# Patient Record
Sex: Male | Born: 1935 | Race: Black or African American | Hispanic: No | Smoking: Former smoker
Health system: Southern US, Community
[De-identification: ages and names within clinical notes are randomized; demographics above are authoritative.]

## PROBLEM LIST (undated history)

## (undated) DIAGNOSIS — I255 Ischemic cardiomyopathy: Secondary | ICD-10-CM

## (undated) DIAGNOSIS — C61 Malignant neoplasm of prostate: Secondary | ICD-10-CM

## (undated) DIAGNOSIS — C241 Malignant neoplasm of ampulla of Vater: Secondary | ICD-10-CM

## (undated) DIAGNOSIS — Z9885 Transplanted organ removal status: Secondary | ICD-10-CM

## (undated) DIAGNOSIS — N183 Chronic kidney disease, stage 3 unspecified: Secondary | ICD-10-CM

## (undated) DIAGNOSIS — C801 Malignant (primary) neoplasm, unspecified: Secondary | ICD-10-CM

## (undated) DIAGNOSIS — C3490 Malignant neoplasm of unspecified part of unspecified bronchus or lung: Secondary | ICD-10-CM

## (undated) DIAGNOSIS — Z905 Acquired absence of kidney: Secondary | ICD-10-CM

## (undated) DIAGNOSIS — M109 Gout, unspecified: Secondary | ICD-10-CM

## (undated) DIAGNOSIS — I10 Essential (primary) hypertension: Secondary | ICD-10-CM

## (undated) DIAGNOSIS — I340 Nonrheumatic mitral (valve) insufficiency: Secondary | ICD-10-CM

## (undated) DIAGNOSIS — E782 Mixed hyperlipidemia: Secondary | ICD-10-CM

## (undated) DIAGNOSIS — I2510 Atherosclerotic heart disease of native coronary artery without angina pectoris: Secondary | ICD-10-CM

## (undated) HISTORY — DX: Gout, unspecified: M10.9

## (undated) HISTORY — PX: CARDIAC SURGERY: SHX584

## (undated) HISTORY — PX: KIDNEY SURGERY: SHX687

---

## 2012-01-14 ENCOUNTER — Encounter (HOSPITAL_COMMUNITY): Payer: Self-pay | Admitting: Emergency Medicine

## 2012-01-14 ENCOUNTER — Emergency Department (HOSPITAL_COMMUNITY): Payer: Medicare Other

## 2012-01-14 ENCOUNTER — Observation Stay (HOSPITAL_COMMUNITY)
Admission: EM | Admit: 2012-01-14 | Discharge: 2012-01-15 | Disposition: A | Discharge status: Home / Self Care | Payer: Medicare Other | Attending: Internal Medicine | Admitting: Internal Medicine

## 2012-01-14 DIAGNOSIS — N289 Disorder of kidney and ureter, unspecified: Secondary | ICD-10-CM | POA: Insufficient documentation

## 2012-01-14 DIAGNOSIS — Z905 Acquired absence of kidney: Secondary | ICD-10-CM | POA: Insufficient documentation

## 2012-01-14 DIAGNOSIS — I10 Essential (primary) hypertension: Secondary | ICD-10-CM

## 2012-01-14 DIAGNOSIS — I2510 Atherosclerotic heart disease of native coronary artery without angina pectoris: Secondary | ICD-10-CM | POA: Diagnosis present

## 2012-01-14 DIAGNOSIS — I200 Unstable angina: Secondary | ICD-10-CM

## 2012-01-14 DIAGNOSIS — Z9885 Transplanted organ removal status: Secondary | ICD-10-CM

## 2012-01-14 DIAGNOSIS — R0602 Shortness of breath: Secondary | ICD-10-CM | POA: Insufficient documentation

## 2012-01-14 DIAGNOSIS — R55 Syncope and collapse: Secondary | ICD-10-CM | POA: Insufficient documentation

## 2012-01-14 DIAGNOSIS — Z951 Presence of aortocoronary bypass graft: Secondary | ICD-10-CM | POA: Insufficient documentation

## 2012-01-14 DIAGNOSIS — R079 Chest pain, unspecified: Principal | ICD-10-CM

## 2012-01-14 HISTORY — DX: Transplanted organ removal status: Z98.85

## 2012-01-14 HISTORY — DX: Nonrheumatic mitral (valve) insufficiency: I34.0

## 2012-01-14 HISTORY — DX: Essential (primary) hypertension: I10

## 2012-01-14 LAB — BASIC METABOLIC PANEL
BUN: 31 mg/dL — ABNORMAL HIGH (ref 6–23)
Calcium: 9.7 mg/dL (ref 8.4–10.5)
Creatinine, Ser: 2.1 mg/dL — ABNORMAL HIGH (ref 0.50–1.35)
GFR calc Af Amer: 33 mL/min — ABNORMAL LOW (ref >90)
GFR calc non Af Amer: 29 mL/min — ABNORMAL LOW (ref >90)

## 2012-01-14 LAB — CBC WITH DIFFERENTIAL/PLATELET
Basophils Absolute: 0 10*3/uL (ref 0.0–0.1)
Basophils Relative: 0 % (ref 0–1)
HCT: 38.6 % — ABNORMAL LOW (ref 39.0–52.0)
MCHC: 35.2 g/dL (ref 30.0–36.0)
Monocytes Absolute: 0.4 10*3/uL (ref 0.1–1.0)
Neutro Abs: 10.1 10*3/uL — ABNORMAL HIGH (ref 1.7–7.7)
Neutrophils Relative %: 88 % — ABNORMAL HIGH (ref 43–77)
Platelets: 123 10*3/uL — ABNORMAL LOW (ref 150–400)
RDW: 12.7 % (ref 11.5–15.5)

## 2012-01-14 MED ORDER — SODIUM CHLORIDE 0.9 % IJ SOLN: 3.0000 mL | INTRAMUSCULAR | Status: DC | PRN

## 2012-01-14 MED ORDER — SODIUM CHLORIDE 0.9 % IV SOLN
250.0000 mL | INTRAVENOUS | Status: DC | PRN
  Administered 2012-01-14: 250 mL via INTRAVENOUS

## 2012-01-14 MED ORDER — SODIUM CHLORIDE 0.9 % IJ SOLN
3.0000 mL | Freq: Two times a day (BID) | INTRAMUSCULAR | Status: DC
  Administered 2012-01-14: 3 mL via INTRAVENOUS
  Filled 2012-01-14: qty 3

## 2012-01-14 MED ORDER — ASPIRIN 81 MG PO CHEW
324.0000 mg | CHEWABLE_TABLET | Freq: Once | ORAL | Status: AC
  Administered 2012-01-14: 324 mg via ORAL
  Filled 2012-01-14: qty 4

## 2012-01-14 NOTE — ED Notes (Signed)
Patient complaining of chest pain starting approximately 1500 today radiating into back. Also complaining of shortness of breath. States the pain "feels like it is thumping or pumping."

## 2012-01-14 NOTE — H&P (Signed)
Chief Complaint:  cp  HPI: 78 yo male s/p cabg 1996 3v comes in with [facility] no radiation lasted 30 min worse or improved by nothing relieved on its own with no assocaited n/v or sob or le edema.  Says had nromal stress test in the past year by his cardiologist.  No fevers no cough.  S/p nephroctomy says his kidney "bleed" and they had to remove it.  Does not know if ckd exists.  No dysuria.  Review of Systems:  O/w neg  Past Medical History: Past Medical History  Diagnosis Date  . MI (mitral incompetence)   . Hypertension   . 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 Kidney surgery   . Cardiac surgery     Medications: Prior to Admission medications   Medication Sig Start Date End Date Taking? Authorizing Provider  aspirin EC 81 MG tablet Take 81 mg by mouth daily.   Yes Historical Provider, MD  atorvastatin (LIPITOR) 40 MG tablet Take 40 mg by mouth daily.   Yes Historical Provider, MD  benazepril-hydrochlorthiazide (LOTENSIN HCT) 20-12.5 MG per tablet Take 1 tablet by mouth 2 (two) times daily.   Yes Historical Provider, MD  fish oil-omega-3 fatty acids 1000 MG capsule Take 1 g by mouth 2 (two) times daily.   Yes Historical Provider, MD    Allergies:   Allergies  Allergen Reactions  . Penicillins Hives    Social History:  reports that he has quit smoking. He does not have any smokeless tobacco history on file. He reports that he does not drink alcohol or use illicit drugs.  Family History: History reviewed. No pertinent family history.  Physical Exam: Filed Vitals:   01/14/12 2022 01/14/12 2025 01/14/12 2123 01/14/12 2152  BP:  127/63 121/67 136/63  Pulse:  67  62  Temp:  97.9 F (36.6 C)    TempSrc:  Oral    Resp:  18 18 20   Height: 5\' 6"  (1.676 m)     Weight: 70.308 kg (155 lb)     SpO2:  96% 100% 100%   General appearance: alert, cooperative and no distress Lungs: clear to auscultation bilaterally Heart:  regular rate and rhythm, S1, S2 normal, no murmur, click, rub or gallop Abdomen: soft, non-tender; bowel sounds normal; no masses,  no organomegaly Extremities: extremities normal, atraumatic, no cyanosis or edema Pulses: 2+ and symmetric Skin: Skin color, texture, turgor normal. No rashes or lesions Neurologic: Grossly normal    Labs on Admission:   [facility] 01/14/12 2111  NA 137  K 4.1  CL 99  CO2 28  GLUCOSE 113*  BUN 31*  CREATININE 2.10*  CALCIUM 9.7  MG --  PHOS --    Basename 01/14/12 2111  WBC 11.4*  NEUTROABS 10.1*  HGB 13.6  HCT 38.6*  MCV 85.4  PLT 123*    Basename 01/14/12 2111  CKTOTAL --  CKMB --  CKMBINDEX --  TROPONINI <0.30   Radiological Exams on Admission: Dg Chest Portable 1 View  01/14/2012  *RADIOLOGY REPORT*  Clinical Data: Chest pain today.  PORTABLE CHEST - 1 VIEW  Comparison: None.  Findings: Postoperative changes in the mediastinum with sternotomy wires, surgical clips, and vascular markers present.  Cardiac enlargement with normal pulmonary vascularity.  Slight fibrosis in the lung bases.  Probable emphysematous changes in the upper lungs. No focal airspace consolidation or edema.  No blunting of costophrenic angles.  No pneumothorax.  Mediastinal contours  appear intact.  Tortuous aorta.  IMPRESSION: Cardiac enlargement and emphysematous changes.  No evidence of active pulmonary disease.   Original Report Authenticated By: Marlon Pel, M.D.     Assessment/Plan Present on Admission:  78 yo male w cp .Chest pain .CAD (coronary artery disease) .Hypertension  Romi, tele ck echo.  Also u/s kidneys due renal failure.  Will need to get records from pcp.  Do not know baseline Cr.  Also need to get stress test results to make sure it was recent and normal.  Glorianne Proctor A [phone] 01/14/2012, 10:41 PM

## 2012-01-14 NOTE — ED Provider Notes (Signed)
History  This chart was scribed for Doug Sou, MD by Bennett Scrape. This patient was seen in room APA04/APA04 and the patient's care was started at 9:07PM.  CSN: [id number]  Arrival date & time 01/14/12  2015   First MD Initiated Contact with Patient 01/14/12 2107      Chief Complaint  Patient presents with  . Chest Pain     Patient is a 78 y.o. male presenting with chest pain. The history is provided by the patient. No language interpreter was used.  Chest Pain The chest pain began 6 - 12 hours ago. Duration of episode(s) is 2 hours. Chest pain occurs constantly. The chest pain is improving. The pain radiates to the upper back. Primary symptoms include shortness of breath. Pertinent negatives for primary symptoms include no cough and no dizziness. He tried nothing for the symptoms.  His past medical history is significant for MI.    John Shepard is a 78 y.o. male with a prior MI and h/o CABG who presents to the Emergency Department complaining of 2 hours of gradual onset, gradually improving, constant left-sided CP that doesn't radiate described as throbbing with associated SOB and light-headedness. Other associated symptoms included nausea and shortness of breath. Patient is asymptomatic now episode lasted 2 hours. Felt like "heart pain but he's had in the past The CP started 6 hours ago and he denies having symptoms currently. He denies taking OTC medications at home to improve symptoms. He reports having prior episodes of similar symptoms several years ago but denies any recent episodes. He denies diaphoresis, nausea and emesis as associated symptoms. He is a former smoker but denies alcohol use.    Dr. Armanda Magic is Cardiologist in [place].  Past Medical History  Diagnosis Date  . MI (mitral incompetence)   . Hypertension   . 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 Kidney surgery   . Cardiac  surgery- bypass     History reviewed. No pertinent family history.  History  Substance Use Topics  . Smoking status: Former Games developer  . Smokeless tobacco: Not on file  . Alcohol Use: No      Review of Systems  Constitutional: Negative.   HENT: Negative.   Respiratory: Positive for shortness of breath. Negative for cough.   Cardiovascular: Positive for chest pain. Negative for leg swelling.  Gastrointestinal: Negative.   Musculoskeletal: Negative.   Skin: Negative.   Neurological: Positive for light-headedness. Negative for dizziness.  Hematological: Negative.   Psychiatric/Behavioral: Negative.   All other systems reviewed and are negative.    Allergies  Penicillins  Home Medications  No current outpatient prescriptions on file.  Triage Vitals: BP 127/63  Pulse 67  Temp 97.9 F (36.6 C) (Oral)  Resp 18  Ht 5\' 6"  (1.676 m)  Wt 155 lb (70.308 kg)  BMI 25.02 kg/m2  SpO2 96%  Physical Exam  Nursing note and vitals reviewed. Constitutional: He appears well-developed and well-nourished.  HENT:  Head: Normocephalic and atraumatic.  Eyes: Conjunctivae are normal. Pupils are equal, round, and reactive to light.  Neck: Neck supple. No tracheal deviation present. No thyromegaly present.  Cardiovascular: Normal rate and regular rhythm.   No murmur heard. Pulmonary/Chest: Effort normal and breath sounds normal.       Sternotomy scar  Abdominal: Soft. Bowel sounds are normal. He exhibits no distension. There is no tenderness.  Musculoskeletal: Normal range of motion.  He exhibits no edema and no tenderness.  Neurological: He is alert. Coordination normal.  Skin: Skin is warm and dry. No rash noted.  Psychiatric: He has a normal mood and affect.    ED Course  Procedures (including critical care time)  DIAGNOSTIC STUDIES: Oxygen Saturation is 96% on room air, adequate by my interpretation.    COORDINATION OF CARE: 9:12PM-Discussed treatment plan which includes blood  work and admission with pt at bedside and pt agreed to plan.   Results for orders placed during the hospital encounter of 01/14/12  TROPONIN I      Component Value Range   Troponin I <0.30  <0.30 ng/mL  BASIC METABOLIC PANEL      Component Value Range   Sodium 137  135 - 145 mEq/L   Potassium 4.1  3.5 - 5.1 mEq/L   Chloride 99  96 - 112 mEq/L   CO2 28  19 - 32 mEq/L   Glucose, Bld 113 (*) 70 - 99 mg/dL   BUN 31 (*) 6 - 23 mg/dL   Creatinine, Ser 1.61 (*) 0.50 - 1.35 mg/dL   Calcium 9.7  8.4 - 09.6 mg/dL   GFR calc non Af Amer 29 (*) >90 mL/min   GFR calc Af Amer 33 (*) >90 mL/min  CBC WITH DIFFERENTIAL      Component Value Range   WBC 11.4 (*) 4.0 - 10.5 K/uL   RBC 4.52  4.22 - 5.81 MIL/uL   Hemoglobin 13.6  13.0 - 17.0 g/dL   HCT 04.5 (*) 40.9 - 81.1 %   MCV 85.4  78.0 - 100.0 fL   MCH 30.1  26.0 - 34.0 pg   MCHC 35.2  30.0 - 36.0 g/dL   RDW 91.4  78.2 - 95.6 %   Platelets 123 (*) 150 - 400 K/uL   Neutrophils Relative 88 (*) 43 - 77 %   Neutro Abs 10.1 (*) 1.7 - 7.7 K/uL   Lymphocytes Relative 8 (*) 12 - 46 %   Lymphs Abs 0.9  0.7 - 4.0 K/uL   Monocytes Relative 4  3 - 12 %   Monocytes Absolute 0.4  0.1 - 1.0 K/uL   Eosinophils Relative 0  0 - 5 %   Eosinophils Absolute 0.0  0.0 - 0.7 K/uL   Basophils Relative 0  0 - 1 %   Basophils Absolute 0.0  0.0 - 0.1 K/uL   Dg Chest Portable 1 View  01/14/2012  *RADIOLOGY REPORT*  Clinical Data: Chest pain today.  PORTABLE CHEST - 1 VIEW  Comparison: None.  Findings: Postoperative changes in the mediastinum with sternotomy wires, surgical clips, and vascular markers present.  Cardiac enlargement with normal pulmonary vascularity.  Slight fibrosis in the lung bases.  Probable emphysematous changes in the upper lungs. No focal airspace consolidation or edema.  No blunting of costophrenic angles.  No pneumothorax.  Mediastinal contours appear intact.  Tortuous aorta.  IMPRESSION: Cardiac enlargement and emphysematous changes.  No  evidence of active pulmonary disease.   Original Report Authenticated By: Marlon Pel, M.D.      No diagnosis found.   Date: 01/14/2012  Rate: 70  Rhythm: normal sinus rhythm  QRS Axis: normal  Intervals: normal  ST/T Wave abnormalities: nonspecific T wave changes  Conduction Disutrbances:none  Narrative Interpretation:   Old EKG Reviewed: none available PVCs present   Results for orders placed during the hospital encounter of 01/14/12  TROPONIN I      Component Value Range  Troponin I <0.30  <0.30 ng/mL  BASIC METABOLIC PANEL      Component Value Range   Sodium 137  135 - 145 mEq/L   Potassium 4.1  3.5 - 5.1 mEq/L   Chloride 99  96 - 112 mEq/L   CO2 28  19 - 32 mEq/L   Glucose, Bld 113 (*) 70 - 99 mg/dL   BUN 31 (*) 6 - 23 mg/dL   Creatinine, Ser 1.61 (*) 0.50 - 1.35 mg/dL   Calcium 9.7  8.4 - 09.6 mg/dL   GFR calc non Af Amer 29 (*) >90 mL/min   GFR calc Af Amer 33 (*) >90 mL/min  CBC WITH DIFFERENTIAL      Component Value Range   WBC 11.4 (*) 4.0 - 10.5 K/uL   RBC 4.52  4.22 - 5.81 MIL/uL   Hemoglobin 13.6  13.0 - 17.0 g/dL   HCT 04.5 (*) 40.9 - 81.1 %   MCV 85.4  78.0 - 100.0 fL   MCH 30.1  26.0 - 34.0 pg   MCHC 35.2  30.0 - 36.0 g/dL   RDW 91.4  78.2 - 95.6 %   Platelets 123 (*) 150 - 400 K/uL   Neutrophils Relative 88 (*) 43 - 77 %   Neutro Abs 10.1 (*) 1.7 - 7.7 K/uL   Lymphocytes Relative 8 (*) 12 - 46 %   Lymphs Abs 0.9  0.7 - 4.0 K/uL   Monocytes Relative 4  3 - 12 %   Monocytes Absolute 0.4  0.1 - 1.0 K/uL   Eosinophils Relative 0  0 - 5 %   Eosinophils Absolute 0.0  0.0 - 0.7 K/uL   Basophils Relative 0  0 - 1 %   Basophils Absolute 0.0  0.0 - 0.1 K/uL   Dg Chest Portable 1 View  01/14/2012  *RADIOLOGY REPORT*  Clinical Data: Chest pain today.  PORTABLE CHEST - 1 VIEW  Comparison: None.  Findings: Postoperative changes in the mediastinum with sternotomy wires, surgical clips, and vascular markers present.  Cardiac enlargement with normal  pulmonary vascularity.  Slight fibrosis in the lung bases.  Probable emphysematous changes in the upper lungs. No focal airspace consolidation or edema.  No blunting of costophrenic angles.  No pneumothorax.  Mediastinal contours appear intact.  Tortuous aorta.  IMPRESSION: Cardiac enlargement and emphysematous changes.  No evidence of active pulmonary disease.   Original Report Authenticated By: Marlon Pel, M.D.     10:35 PM remains asymptomatic MDM  Spoke with Dr. Onalee Hua plan 23 observation telemetry Diagnosis #1 unstable angina #2 renal insufficiency          Doug Sou, MD 01/14/12 2240

## 2012-01-14 NOTE — ED Notes (Signed)
Patient denies pain and is resting comfortably.  

## 2012-01-15 ENCOUNTER — Encounter (HOSPITAL_COMMUNITY): Payer: Self-pay

## 2012-01-15 ENCOUNTER — Observation Stay (HOSPITAL_COMMUNITY): Payer: Medicare Other

## 2012-01-15 LAB — CARDIAC PANEL(CRET KIN+CKTOT+MB+TROPI)
Relative Index: INVALID (ref 0.0–2.5)
Relative Index: INVALID (ref 0.0–2.5)
Total CK: 76 U/L (ref 7–232)
Total CK: 78 U/L (ref 7–232)
Troponin I: <0.3 ng/mL (ref <0.30)

## 2012-01-15 LAB — URINALYSIS, ROUTINE W REFLEX MICROSCOPIC
Bilirubin Urine: NEGATIVE
Glucose, UA: NEGATIVE mg/dL
Hgb urine dipstick: NEGATIVE
Ketones, ur: NEGATIVE mg/dL
Nitrite: NEGATIVE
Specific Gravity, Urine: 1.005 (ref 1.005–1.030)
pH: 5.5 (ref 5.0–8.0)

## 2012-01-15 MED ORDER — ASPIRIN EC 81 MG PO TBEC
81.0000 mg | DELAYED_RELEASE_TABLET | Freq: Every day | ORAL | Status: DC
  Administered 2012-01-15: 81 mg via ORAL
  Filled 2012-01-15: qty 1

## 2012-01-15 MED ORDER — SODIUM CHLORIDE 0.9 % IV SOLN
INTRAVENOUS | Status: DC
  Administered 2012-01-15 (×2): via INTRAVENOUS

## 2012-01-15 MED ORDER — ATORVASTATIN CALCIUM 40 MG PO TABS: 40.0000 mg | ORAL_TABLET | Freq: Every day | ORAL | Status: DC

## 2012-01-15 MED ORDER — HEPARIN SODIUM (PORCINE) 5000 UNIT/ML IJ SOLN: 5000.0000 [IU] | Freq: Three times a day (TID) | INTRAMUSCULAR | Status: DC

## 2012-01-15 MED ORDER — SODIUM CHLORIDE 0.9 % IJ SOLN
3.0000 mL | Freq: Two times a day (BID) | INTRAMUSCULAR | Status: DC
  Administered 2012-01-15: 3 mL via INTRAVENOUS
  Filled 2012-01-15: qty 3

## 2012-01-15 MED ORDER — NITROGLYCERIN 0.4 MG SL SUBL: 0.4000 mg | SUBLINGUAL_TABLET | SUBLINGUAL | Status: AC | PRN

## 2012-01-15 NOTE — Discharge Summary (Signed)
Physician Discharge Summary  Patient ID: John Shepard MRN: [id number] DOB/AGE: [birth year] 78 y.o.  Admit date: 01/14/2012 Discharge date: 01/15/2012  Discharge Diagnoses:  Principal Problem:  *Chest pain Active Problems:  CAD (coronary artery disease) Renal insufficiency  Hypertension Post nephrectomy  Medication List  As of 01/15/2012  1:30 PM   TAKE these medications         aspirin EC 81 MG tablet   Take 81 mg by mouth daily.      atorvastatin 40 MG tablet   Commonly known as: LIPITOR   Take 40 mg by mouth daily.      benazepril-hydrochlorthiazide 20-12.5 MG per tablet   Commonly known as: LOTENSIN HCT   Take 1 tablet by mouth 2 (two) times daily.      fish oil-omega-3 fatty acids 1000 MG capsule   Take 1 g by mouth 2 (two) times daily.      nitroGLYCERIN 0.4 MG SL tablet   Commonly known as: NITROSTAT   Place 1 tablet (0.4 mg total) under the tongue every 5 (five) minutes as needed for chest pain (3 tablets maximum).            Discharge Orders    Future Orders Please Complete By Expires   Diet - low sodium heart healthy      Activity as tolerated - No restrictions         Follow-up Information    Schedule an appointment as soon as possible for a visit with Gretta Cool Antoin, LCSW. (If symptoms worsen)    Contact information:   - - - - [phone]          Disposition: home  Discharged Condition: stable  Consults:  none  Labs:   Results for orders placed during the hospital encounter of 01/14/12 (from the past 48 hour(s))  TROPONIN I     Status: Normal   Collection Time   01/14/12  9:11 PM      Component Value Range Comment   Troponin I <0.30  <0.30 ng/mL   BASIC METABOLIC PANEL     Status: Abnormal   Collection Time   01/14/12  9:11 PM      Component Value Range Comment   Sodium 137  135 - 145 mEq/L    Potassium 4.1  3.5 - 5.1 mEq/L    Chloride 99  96 - 112 mEq/L    CO2 28  19 - 32 mEq/L    Glucose, Bld 113 (*) 70 - 99 mg/dL     BUN 31 (*) 6 - 23 mg/dL    Creatinine, Ser 4.09 (*) 0.50 - 1.35 mg/dL    Calcium 9.7  8.4 - 81.1 mg/dL    GFR calc non Af Amer 29 (*) >90 mL/min    GFR calc Af Amer 33 (*) >90 mL/min   CBC WITH DIFFERENTIAL     Status: Abnormal   Collection Time   01/14/12  9:11 PM      Component Value Range Comment   WBC 11.4 (*) 4.0 - 10.5 K/uL    RBC 4.52  4.22 - 5.81 MIL/uL    Hemoglobin 13.6  13.0 - 17.0 g/dL    HCT 91.4 (*) 78.2 - 52.0 %    MCV 85.4  78.0 - 100.0 fL    MCH 30.1  26.0 - 34.0 pg    MCHC 35.2  30.0 - 36.0 g/dL    RDW 95.6  21.3 - 08.6 %    Platelets 123 (*)  150 - 400 K/uL    Neutrophils Relative 88 (*) 43 - 77 %    Neutro Abs 10.1 (*) 1.7 - 7.7 K/uL    Lymphocytes Relative 8 (*) 12 - 46 %    Lymphs Abs 0.9  0.7 - 4.0 K/uL    Monocytes Relative 4  3 - 12 %    Monocytes Absolute 0.4  0.1 - 1.0 K/uL    Eosinophils Relative 0  0 - 5 %    Eosinophils Absolute 0.0  0.0 - 0.7 K/uL    Basophils Relative 0  0 - 1 %    Basophils Absolute 0.0  0.0 - 0.1 K/uL   CARDIAC PANEL(CRET KIN+CKTOT+MB+TROPI)     Status: Normal   Collection Time   01/15/12  1:22 AM      Component Value Range Comment   Total CK 76  7 - 232 U/L    CK, MB 2.3  0.3 - 4.0 ng/mL    Troponin I <0.30  <0.30 ng/mL    Relative Index RELATIVE INDEX IS INVALID  0.0 - 2.5   URINALYSIS, ROUTINE W REFLEX MICROSCOPIC     Status: Normal   Collection Time   01/15/12  8:15 AM      Component Value Range Comment   Color, Urine YELLOW  YELLOW    APPearance CLEAR  CLEAR    Specific Gravity, Urine 1.005  1.005 - 1.030    pH 5.5  5.0 - 8.0    Glucose, UA NEGATIVE  NEGATIVE mg/dL    Hgb urine dipstick NEGATIVE  NEGATIVE    Bilirubin Urine NEGATIVE  NEGATIVE    Ketones, ur NEGATIVE  NEGATIVE mg/dL    Protein, ur NEGATIVE  NEGATIVE mg/dL    Urobilinogen, UA 0.2  0.0 - 1.0 mg/dL    Nitrite NEGATIVE  NEGATIVE    Leukocytes, UA NEGATIVE  NEGATIVE MICROSCOPIC NOT DONE ON URINES WITH NEGATIVE PROTEIN, BLOOD, LEUKOCYTES, NITRITE, OR  GLUCOSE <1000 mg/dL.  CARDIAC PANEL(CRET KIN+CKTOT+MB+TROPI)     Status: Normal   Collection Time   01/15/12  8:55 AM      Component Value Range Comment   Total CK 78  7 - 232 U/L    CK, MB 2.2  0.3 - 4.0 ng/mL    Troponin I <0.30  <0.30 ng/mL    Relative Index RELATIVE INDEX IS INVALID  0.0 - 2.5     Diagnostics:  US Renal  01/15/2012  *RADIOLOGY REPORT*  Clinical Data: History of acute renal failure.  History of prior left nephrectomy.  RENAL/URINARY TRACT ULTRASOUND COMPLETE  Comparison:  None.  Findings:  Right Kidney:  Right renal length is 12.9 cm. Examination of the right kidney shows no evidence of hydronephrosis, solid or cystic mass, calculus, parenchymal loss, or parenchymal textural abnormality.  Left Kidney:  There is history of previous left nephrectomy.  No left kidney is evident.  Bladder:  No urinary bladder abnormality is seen.  No ureteral jet was visualized.  IMPRESSION: Post left nephrectomy.  Compensatory hypertrophy of right kidney which otherwise appears normal.   Original Report Authenticated By: Crawford Givens, M.D.    Dg Chest Portable 1 View  01/14/2012  *RADIOLOGY REPORT*  Clinical Data: Chest pain today.  PORTABLE CHEST - 1 VIEW  Comparison: None.  Findings: Postoperative changes in the mediastinum with sternotomy wires, surgical clips, and vascular markers present.  Cardiac enlargement with normal pulmonary vascularity.  Slight fibrosis in the lung bases.  Probable emphysematous changes in the  upper lungs. No focal airspace consolidation or edema.  No blunting of costophrenic angles.  No pneumothorax.  Mediastinal contours appear intact.  Tortuous aorta.  IMPRESSION: Cardiac enlargement and emphysematous changes.  No evidence of active pulmonary disease.   Original Report Authenticated By: Marlon Pel, M.D.    EKG: NSR , PACs, LVH, nonspecific ST changes   Hospital Course:  See H&P for complete admission details. John Shepard is a 78 year old black male who  presented with about 30 minutes of chest pain. He has a history of coronary artery disease and CABG in 1996. By the time he was in the emergency room, chest pain had resolved. He sees Dr. Beckie Salts in [place] for his heart disease. Patient was admitted overnight and ruled out for MI. He had no recurrence of chest pain and was able to cannulate about the unit without difficulty. Records were obtained from Dr. Albertina Senegal office. He had a negative stress echocardiogram in May of this year. He is being discharged to home with prescription for sublingual nitroglycerin. He will follow with Dr. Beckie Salts and if he continues to have chest pain, may need cardiac catheterization. His creatinine was elevated here and he is post nephrectomy. Baseline creatinine is unknown.  Discharge Exam:  Blood pressure 155/81, pulse 60, temperature 97.6 F (36.4 C), temperature source Oral, resp. rate 20, height 5\' 6"  (1.676 m), weight 68.675 kg (151 lb 6.4 oz), SpO2 100.00%.  General: Comfortable. Lungs clear to auscultation bilaterally without wheezes rhonchi or rales Cardiovascular regular rate and rhythm without murmurs gallops rubs Abdomen soft nontender nondistended Extremities no clubbing cyanosis or edema  Signed: Brityn Mastrogiovanni L 01/15/2012, 1:30 PM

## 2012-01-15 NOTE — Plan of Care (Signed)
Problem: Phase I Progression Outcomes Goal: Aspirin unless contraindicated Outcome: Completed/Met Date Met:  01/15/12 In ED

## 2012-01-15 NOTE — Progress Notes (Signed)
Pt's IV removed.  Pt tolerated well.  Pt and family provided with d/c instructions.  Pt verbalized understanding of d/c instructions.  Pt provided with prescription for Nitro tabs.  Pt stable at d/c.

## 2012-01-15 NOTE — Progress Notes (Signed)
UR Chart Review Completed  

## 2015-03-09 ENCOUNTER — Encounter (INDEPENDENT_AMBULATORY_CARE_PROVIDER_SITE_OTHER): Payer: Self-pay | Admitting: *Deleted

## 2015-04-11 ENCOUNTER — Other Ambulatory Visit (INDEPENDENT_AMBULATORY_CARE_PROVIDER_SITE_OTHER): Payer: Self-pay | Admitting: Internal Medicine

## 2015-04-11 ENCOUNTER — Ambulatory Visit (INDEPENDENT_AMBULATORY_CARE_PROVIDER_SITE_OTHER): Payer: Medicare Other | Admitting: Internal Medicine

## 2015-04-11 ENCOUNTER — Telehealth (INDEPENDENT_AMBULATORY_CARE_PROVIDER_SITE_OTHER): Payer: Self-pay | Admitting: *Deleted

## 2015-04-11 ENCOUNTER — Encounter (INDEPENDENT_AMBULATORY_CARE_PROVIDER_SITE_OTHER): Payer: Self-pay | Admitting: Internal Medicine

## 2015-04-11 VITALS — BP 180/62 | HR 65 | Temp 98.4°F | Ht 66.0 in | Wt 146.3 lb

## 2015-04-11 DIAGNOSIS — Z1211 Encounter for screening for malignant neoplasm of colon: Secondary | ICD-10-CM

## 2015-04-11 NOTE — Telephone Encounter (Signed)
Patient needs trilyte 

## 2015-04-11 NOTE — Patient Instructions (Signed)
The risks and benefits such as perforation, bleeding, and infection were reviewed with the patient and is agreeable. 

## 2015-04-11 NOTE — Progress Notes (Signed)
   Subjective:    Patient ID: John Shepard, male    DOB: 1932/11/19, 81 y.o.   MRN: [id number]  HPI Referred to our office by Dr. Briscoe Deutscher for melena/colonoscopy.  He tells me he has been having black stools for a couple of months.  He says all his stools are black. There is no abdominal pain. His appetite is good.  He thinks he may have lost about 10 pounds over the past 4 months.  He usually has a BM daily. No change in his stools.  Hx of prostate cancer. One son had stomach cancer and was a drinker.  No family hx of colon cancer. Takes a BABY ASA daily.     02/26/2015 H and H 13.6 and 41.7, Platelet ct 127.  Total bili 0.7, ALP 90, AST 14, ALT 11    Review of Systems Past Medical History  Diagnosis Date  . MI (mitral incompetence)   . Hypertension   . 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 Gout     Past Surgical History  Procedure Laterality Date  . Kidney surgery    . Cardiac surgery      CABG in 1996    Allergies  Allergen Reactions  . Penicillins Hives    Current Outpatient Prescriptions on File Prior to Visit  Medication Sig Dispense Refill  . aspirin EC 81 MG tablet Take 81 mg by mouth daily.    Marland Kitchen atorvastatin (LIPITOR) 40 MG tablet Take 40 mg by mouth daily.    . benazepril-hydrochlorthiazide (LOTENSIN HCT) 20-12.5 MG per tablet Take 1 tablet by mouth 2 (two) times daily.    . fish oil-omega-3 fatty acids 1000 MG capsule Take 1 g by mouth 2 (two) times daily.    . nitroGLYCERIN (NITROSTAT) 0.4 MG SL tablet Place 1 tablet (0.4 mg total) under the tongue every 5 (five) minutes as needed for chest pain (3 tablets maximum). 15 tablet 0   No current facility-administered medications on file prior to visit.        Objective:   Physical Exam Blood pressure 180/62, pulse 65, temperature 98.4 F (36.9 C), height '5\' 6"'$  (1.676 m), weight 146 lb 4.8 oz (66.361 kg). Alert and oriented. Skin warm and dry. Oral mucosa is moist.   . Sclera anicteric,  conjunctivae is pink. Thyroid not enlarged. No cervical lymphadenopathy. Lungs clear. Heart regular rate and rhythm.  Abdomen is soft. Bowel sounds are positive. No hepatomegaly. No abdominal masses felt. No tenderness.  No edema to lower extremities.   Stool is dark brown and guaiac negative. (I showed patient his stool sample and he agreed this is what is stool usually looked like, sometimes darker)    Lot [id number] Ex 9/17     Assessment & Plan:   Stool negative in office for blood. H and H stable. Patient has never undergone a colonoscopy in the past and I will schedule this for him. The risks and benefits such as perforation, bleeding, and infection were reviewed with the patient and is agreeable.

## 2015-04-14 MED ORDER — PEG 3350-KCL-NA BICARB-NACL 420 G PO SOLR: 4000.0000 mL | Freq: Once | ORAL | Status: DC

## 2015-05-17 ENCOUNTER — Ambulatory Visit (HOSPITAL_COMMUNITY)
Admission: RE | Admit: 2015-05-17 | Discharge: 2015-05-17 | Disposition: A | Discharge status: Home / Self Care | Payer: Medicare Other | Source: Ambulatory Visit | Attending: Internal Medicine | Admitting: Internal Medicine

## 2015-05-17 ENCOUNTER — Encounter (HOSPITAL_COMMUNITY): Payer: Self-pay | Admitting: *Deleted

## 2015-05-17 ENCOUNTER — Encounter (HOSPITAL_COMMUNITY)
Admission: RE | Disposition: A | Discharge status: Home / Self Care | Payer: Self-pay | Source: Ambulatory Visit | Attending: Internal Medicine

## 2015-05-17 DIAGNOSIS — Z79899 Other long term (current) drug therapy: Secondary | ICD-10-CM | POA: Diagnosis not present

## 2015-05-17 DIAGNOSIS — K648 Other hemorrhoids: Secondary | ICD-10-CM | POA: Diagnosis not present

## 2015-05-17 DIAGNOSIS — Z951 Presence of aortocoronary bypass graft: Secondary | ICD-10-CM | POA: Diagnosis not present

## 2015-05-17 DIAGNOSIS — I10 Essential (primary) hypertension: Secondary | ICD-10-CM | POA: Diagnosis not present

## 2015-05-17 DIAGNOSIS — Z7982 Long term (current) use of aspirin: Secondary | ICD-10-CM | POA: Diagnosis not present

## 2015-05-17 DIAGNOSIS — Z1211 Encounter for screening for malignant neoplasm of colon: Secondary | ICD-10-CM | POA: Insufficient documentation

## 2015-05-17 DIAGNOSIS — K644 Residual hemorrhoidal skin tags: Secondary | ICD-10-CM | POA: Insufficient documentation

## 2015-05-17 DIAGNOSIS — Z87891 Personal history of nicotine dependence: Secondary | ICD-10-CM | POA: Insufficient documentation

## 2015-05-17 DIAGNOSIS — D120 Benign neoplasm of cecum: Secondary | ICD-10-CM | POA: Diagnosis not present

## 2015-05-17 DIAGNOSIS — I340 Nonrheumatic mitral (valve) insufficiency: Secondary | ICD-10-CM | POA: Insufficient documentation

## 2015-05-17 HISTORY — PX: COLONOSCOPY: SHX5424

## 2015-05-17 SURGERY — COLONOSCOPY
Anesthesia: Moderate Sedation

## 2015-05-17 MED ORDER — MEPERIDINE HCL 50 MG/ML IJ SOLN
INTRAMUSCULAR | Status: DC | PRN
  Administered 2015-05-17: 25 mg

## 2015-05-17 MED ORDER — MIDAZOLAM HCL 5 MG/5ML IJ SOLN
INTRAMUSCULAR | Status: DC | PRN
  Administered 2015-05-17: 1 mg via INTRAVENOUS
  Administered 2015-05-17: 2 mg via INTRAVENOUS

## 2015-05-17 MED ORDER — MIDAZOLAM HCL 5 MG/5ML IJ SOLN
INTRAMUSCULAR | Status: AC
  Filled 2015-05-17: qty 10

## 2015-05-17 MED ORDER — SODIUM CHLORIDE 0.9 % IV SOLN
INTRAVENOUS | Status: DC
  Administered 2015-05-17: 1000 mL via INTRAVENOUS

## 2015-05-17 MED ORDER — MEPERIDINE HCL 50 MG/ML IJ SOLN
INTRAMUSCULAR | Status: AC
  Filled 2015-05-17: qty 1

## 2015-05-17 NOTE — H&P (Signed)
John Shepard is an 81 y.o. male.   Chief Complaint: Patient is here for colonoscopy. HPI: A shunt is 81 year old African-American male was undergoing colonoscopy primarily for screening purposes. He's been passing dark stool and thought he was passing blood with stool was guaiac-negative. He denies abdominal pain or recent changes of habits. He has never been screened for CRC. Family history is negative for CRC.  Past Medical History  Diagnosis Date  . MI (mitral incompetence)   . Hypertension   . 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 Gout     Past Surgical History  Procedure Laterality Date  . Kidney surgery    . Cardiac surgery      CABG in 1996    History reviewed. No pertinent family history. Social History:  reports that he has quit smoking. He does not have any smokeless tobacco history on file. He reports that he does not drink alcohol or use illicit drugs.  Allergies:  Allergies  Allergen Reactions  . Penicillins Hives    Medications Prior to Admission  Medication Sig Dispense Refill  . aspirin EC 81 MG tablet Take 81 mg by mouth daily.    Marland Kitchen atorvastatin (LIPITOR) 40 MG tablet Take 40 mg by mouth daily.    . polyethylene glycol-electrolytes (NULYTELY/GOLYTELY) 420 G solution Take 4,000 mLs by mouth once. 4000 mL 0  . allopurinol (ZYLOPRIM) 100 MG tablet Take 100 mg by mouth daily.    . benazepril-hydrochlorthiazide (LOTENSIN HCT) 20-12.5 MG per tablet Take 1 tablet by mouth 2 (two) times daily.    . fish oil-omega-3 fatty acids 1000 MG capsule Take 1 g by mouth 2 (two) times daily.    . nitroGLYCERIN (NITROSTAT) 0.4 MG SL tablet Place 1 tablet (0.4 mg total) under the tongue every 5 (five) minutes as needed for chest pain (3 tablets maximum). 15 tablet 0    No results found for this or any previous visit (from the past 48 hour(s)). No results found.  ROS  Blood pressure 176/79, pulse 77, temperature 97.5 F (36.4 C), temperature source Oral, resp.  rate 18, height '5\' 6"'$  (1.676 m), weight 150 lb (68.04 kg), SpO2 100 %. Physical Exam  Constitutional:  Pleasant well-developed thin African-American male in NAD.  HENT:  Mouth/Throat: Oropharynx is clear and moist.  Eyes: Conjunctivae are normal. No scleral icterus.  Neck: No thyromegaly present.  Cardiovascular:  Mid Sternal scar.  Normal S1 with split S2 and faint systolic murmur is heard at left sternal border.  Respiratory: Effort normal and breath sounds normal.  GI: Soft. He exhibits no distension and no mass. There is no tenderness.  Musculoskeletal: He exhibits no edema.  Lymphadenopathy:    He has no cervical adenopathy.  Neurological: He is alert.  Skin: Skin is warm and dry.     Assessment/Plan Average risk screening colonoscopy.  REHMAN,NAJEEB U 05/17/2015, 1:36 PM

## 2015-05-17 NOTE — Op Note (Signed)
COLONOSCOPY PROCEDURE REPORT  PATIENT:  John Shepard  MR#:  [id number] Birthdate:  01-30-1933, 81 y.o., male Endoscopist:  Dr. Rogene Houston, MD Referred By:  Dr. Briscoe Deutscher, MD  Procedure Date: 05/17/2015  Procedure:   Colonoscopy  Indications:  Patient is 81 year old African-American male who is undergoing colonoscopy primarily for screening purposes. Is passing dark stools and thought was blood in stool was guaiac negative. H&H is normal. This is patient's first screening exam. Family history is negative for CRC.  Informed Consent:  The procedure and risks were reviewed with the patient and informed consent was obtained.  Medications:  Demerol 25 mg IV Versed 3 mg IV(2 mg possibly infiltrated)  Description of procedure:  After a digital rectal exam was performed, that colonoscope was advanced from the anus through the rectum and colon to the area of the cecum, ileocecal valve and appendiceal orifice. The cecum was deeply intubated. These structures were well-seen and photographed for the record. From the level of the cecum and ileocecal valve, the scope was slowly and cautiously withdrawn. The mucosal surfaces were carefully surveyed utilizing scope tip to flexion to facilitate fold flattening as needed. The scope was pulled down into the rectum where a thorough exam including retroflexion was performed.  Findings:   Prep excellent. 5 mm polyp cold snared from cecum. Mucosa of rest of the colon and rectum was normal. Dominant hemorrhoids noted below the dentate line.   Therapeutic/Diagnostic Maneuvers Performed:  See above  Complications:  None  EBL: None  Cecal Withdrawal Time:  12 minutes  Impression:  Examination performed to cecum. 5 mm cecal polyp cold snared. Prominent external hemorrhoids.  Recommendations:  Standard instructions given. I will contact patient with biopsy results and further recommendations.  REHMAN,NAJEEB U  05/17/2015 2:21 PM  CC: Dr.  Alison Stalling [facility] & Dr. No ref. provider found

## 2015-05-17 NOTE — Discharge Instructions (Signed)
Resume usual medications and diet. No driving for 24 hours. Physician will call with biopsy results. Colonoscopy, Care After These instructions give you information on caring for yourself after your procedure. Your doctor may also give you more specific instructions. Call your doctor if you have any problems or questions after your procedure. HOME CARE  Do not drive for 24 hours.  Do not sign important papers or use machinery for 24 hours.  You may shower.  You may go back to your usual activities, but go slower for the first 24 hours.  Take rest breaks often during the first 24 hours.  Walk around or use warm packs on your belly (abdomen) if you have belly cramping or gas.  Drink enough fluids to keep your pee (urine) clear or pale yellow.  Resume your normal diet. Avoid heavy or fried foods.  Avoid drinking alcohol for 24 hours or as told by your doctor.  Only take medicines as told by your doctor. If a tissue sample (biopsy) was taken during the procedure:   Do not take aspirin or blood thinners for 7 days, or as told by your doctor.  Do not drink alcohol for 7 days, or as told by your doctor.  Eat soft foods for the first 24 hours. GET HELP IF: You still have a small amount of blood in your poop (stool) 2-3 days after the procedure. GET HELP RIGHT AWAY IF:  You have more than a small amount of blood in your poop.  You see clumps of tissue (blood clots) in your poop.  Your belly is puffy (swollen).  You feel sick to your stomach (nauseous) or throw up (vomit).  You have a fever.  You have belly pain that gets worse and medicine does not help. MAKE SURE YOU:  Understand these instructions.  Will watch your condition.  Will get help right away if you are not doing well or get worse.   This information is not intended to replace advice given to you by your health care provider. Make sure you discuss any questions you have with your health care provider.     Document Released: 06/15/2010 Document Revised: 05/18/2013 Document Reviewed: 01/18/2013 Elsevier Interactive Patient Education 2016 [employer].  Hemorrhoids Hemorrhoids are swollen veins around the rectum or anus. There are two types of hemorrhoids:   Internal hemorrhoids. These occur in the veins just inside the rectum. They may poke through to the outside and become irritated and painful.  External hemorrhoids. These occur in the veins outside the anus and can be felt as a painful swelling or hard lump near the anus. CAUSES  Pregnancy.   Obesity.   Constipation or diarrhea.   Straining to have a bowel movement.   Sitting for long periods on the toilet.  Heavy lifting or other activity that caused you to strain.  Anal intercourse. SYMPTOMS   Pain.   Anal itching or irritation.   Rectal bleeding.   Fecal leakage.   Anal swelling.   One or more lumps around the anus.  DIAGNOSIS  Your caregiver may be able to diagnose hemorrhoids by visual examination. Other examinations or tests that may be performed include:   Examination of the rectal area with a gloved hand (digital rectal exam).   Examination of anal canal using a small tube (scope).   A blood test if you have lost a significant amount of blood.  A test to look inside the colon (sigmoidoscopy or colonoscopy). TREATMENT Most hemorrhoids can be  treated at home. However, if symptoms do not seem to be getting better or if you have a lot of rectal bleeding, your caregiver may perform a procedure to help make the hemorrhoids get smaller or remove them completely. Possible treatments include:   Placing a rubber band at the base of the hemorrhoid to cut off the circulation (rubber band ligation).   Injecting a chemical to shrink the hemorrhoid (sclerotherapy).   Using a tool to burn the hemorrhoid (infrared light therapy).   Surgically removing the hemorrhoid (hemorrhoidectomy).   Stapling  the hemorrhoid to block blood flow to the tissue (hemorrhoid stapling).  HOME CARE INSTRUCTIONS   Eat foods with fiber, such as whole grains, beans, nuts, fruits, and vegetables. Ask your doctor about taking products with added fiber in them (fibersupplements).  Increase fluid intake. Drink enough water and fluids to keep your urine clear or pale yellow.   Exercise regularly.   Go to the bathroom when you have the urge to have a bowel movement. Do not wait.   Avoid straining to have bowel movements.   Keep the anal area dry and clean. Use wet toilet paper or moist towelettes after a bowel movement.   Medicated creams and suppositories may be used or applied as directed.   Only take over-the-counter or prescription medicines as directed by your caregiver.   Take warm sitz baths for 15-20 minutes, 3-4 times a day to ease pain and discomfort.   Place ice packs on the hemorrhoids if they are tender and swollen. Using ice packs between sitz baths may be helpful.   Put ice in a plastic bag.   Place a towel between your skin and the bag.   Leave the ice on for 15-20 minutes, 3-4 times a day.   Do not use a donut-shaped pillow or sit on the toilet for long periods. This increases blood pooling and pain.  SEEK MEDICAL CARE IF:  You have increasing pain and swelling that is not controlled by treatment or medicine.  You have uncontrolled bleeding.  You have difficulty or you are unable to have a bowel movement.  You have pain or inflammation outside the area of the hemorrhoids. MAKE SURE YOU:  Understand these instructions.  Will watch your condition.  Will get help right away if you are not doing well or get worse.   This information is not intended to replace advice given to you by your health care provider. Make sure you discuss any questions you have with your health care provider.   Document Released: 05/10/2000 Document Revised: 04/29/2012 Document Reviewed:  03/17/2012 Elsevier Interactive Patient Education 2016 Elsevier Inc.   Colon Polyps Polyps are lumps of extra tissue growing inside the body. Polyps can grow in the large intestine (colon). Most colon polyps are noncancerous (benign). However, some colon polyps can become cancerous over time. Polyps that are larger than a pea may be harmful. To be safe, caregivers remove and test all polyps. CAUSES  Polyps form when mutations in the genes cause your cells to grow and divide even though no more tissue is needed. RISK FACTORS There are a number of risk factors that can increase your chances of getting colon polyps. They include:  Being older than 50 years.  Family history of colon polyps or colon cancer.  Long-term colon diseases, such as colitis or Crohn disease.  Being overweight.  Smoking.  Being inactive.  Drinking too much alcohol. SYMPTOMS  Most small polyps do not cause symptoms. If  symptoms are present, they may include:  Blood in the stool. The stool may look dark red or black.  Constipation or diarrhea that lasts longer than 1 week. DIAGNOSIS People often do not know they have polyps until their caregiver finds them during a regular checkup. Your caregiver can use 4 tests to check for polyps:  Digital rectal exam. The caregiver wears gloves and feels inside the rectum. This test would find polyps only in the rectum.  Barium enema. The caregiver puts a liquid called barium into your rectum before taking X-rays of your colon. Barium makes your colon look white. Polyps are dark, so they are easy to see in the X-ray pictures.  Sigmoidoscopy. A thin, flexible tube (sigmoidoscope) is placed into your rectum. The sigmoidoscope has a light and tiny camera in it. The caregiver uses the sigmoidoscope to look at the last third of your colon.  Colonoscopy. This test is like sigmoidoscopy, but the caregiver looks at the entire colon. This is the most common method for finding and  removing polyps. TREATMENT  Any polyps will be removed during a sigmoidoscopy or colonoscopy. The polyps are then tested for cancer. PREVENTION  To help lower your risk of getting more colon polyps:  Eat plenty of fruits and vegetables. Avoid eating fatty foods.  Do not smoke.  Avoid drinking alcohol.  Exercise every day.  Lose weight if recommended by your caregiver.  Eat plenty of calcium and folate. Foods that are rich in calcium include milk, cheese, and broccoli. Foods that are rich in folate include chickpeas, kidney beans, and spinach. HOME CARE INSTRUCTIONS Keep all follow-up appointments as directed by your caregiver. You may need periodic exams to check for polyps. SEEK MEDICAL CARE IF: You notice bleeding during a bowel movement.   This information is not intended to replace advice given to you by your health care provider. Make sure you discuss any questions you have with your health care provider.   Document Released: 02/07/2004 Document Revised: 06/03/2014 Document Reviewed: 07/23/2011 Elsevier Interactive Patient Education [employer].

## 2015-05-22 ENCOUNTER — Encounter (HOSPITAL_COMMUNITY): Payer: Self-pay | Admitting: *Deleted

## 2015-05-22 ENCOUNTER — Emergency Department (HOSPITAL_COMMUNITY): Payer: Medicare Other

## 2015-05-22 ENCOUNTER — Emergency Department (HOSPITAL_COMMUNITY)
Admission: EM | Admit: 2015-05-22 | Discharge: 2015-05-22 | Disposition: A | Discharge status: Home / Self Care | Payer: Medicare Other | Attending: Emergency Medicine | Admitting: Emergency Medicine

## 2015-05-22 DIAGNOSIS — R05 Cough: Secondary | ICD-10-CM | POA: Diagnosis present

## 2015-05-22 DIAGNOSIS — J069 Acute upper respiratory infection, unspecified: Secondary | ICD-10-CM | POA: Insufficient documentation

## 2015-05-22 DIAGNOSIS — Z880 Allergy status to penicillin: Secondary | ICD-10-CM | POA: Diagnosis not present

## 2015-05-22 DIAGNOSIS — I10 Essential (primary) hypertension: Secondary | ICD-10-CM | POA: Insufficient documentation

## 2015-05-22 DIAGNOSIS — Z87891 Personal history of nicotine dependence: Secondary | ICD-10-CM | POA: Diagnosis not present

## 2015-05-22 DIAGNOSIS — Z79899 Other long term (current) drug therapy: Secondary | ICD-10-CM | POA: Diagnosis not present

## 2015-05-22 DIAGNOSIS — Z7982 Long term (current) use of aspirin: Secondary | ICD-10-CM | POA: Diagnosis not present

## 2015-05-22 DIAGNOSIS — Z940 Kidney transplant status: Secondary | ICD-10-CM | POA: Insufficient documentation

## 2015-05-22 DIAGNOSIS — I252 Old myocardial infarction: Secondary | ICD-10-CM | POA: Diagnosis not present

## 2015-05-22 DIAGNOSIS — M109 Gout, unspecified: Secondary | ICD-10-CM | POA: Diagnosis not present

## 2015-05-22 MED ORDER — BENZONATATE 100 MG PO CAPS: 100.0000 mg | ORAL_CAPSULE | Freq: Three times a day (TID) | ORAL | Status: DC

## 2015-05-22 MED ORDER — ONDANSETRON 4 MG PO TBDP: 4.0000 mg | ORAL_TABLET | Freq: Three times a day (TID) | ORAL | Status: DC | PRN

## 2015-05-22 NOTE — ED Notes (Signed)
Patient with no complaints at this time. Respirations even and unlabored. Skin warm/dry. Discharge instructions reviewed with patient at this time. Patient given opportunity to voice concerns/ask questions. Patient discharged at this time and left Emergency Department with steady gait.   

## 2015-05-22 NOTE — ED Provider Notes (Signed)
History  By signing my name below, I, Marlowe Kays, attest that this documentation has been prepared under the direction and in the presence of Tanna Furry, MD. Electronically Signed: Marlowe Kays, ED Scribe. 05/22/2015. 12:11 PM.  Chief Complaint  Patient presents with  . Cough  . Emesis   The history is provided by the patient and medical records. No language interpreter was used.    HPI Comments:  John Shepard is a 81 y.o. male who presents to the Emergency Department complaining of mildly productive cough of clear mucous that began about three days ago. He reports chills, subjective fever and left sided abdominal pain secondary to coughing. Pt reports a mild sore throat. He states coughing and breathing increases the abdominal pain. He denies alleviating factors. He has not taken anything to treat his symptoms. He denies nausea, vomiting or diarrhea. He is unsure if he had a flu vaccination this year.  Past Medical History  Diagnosis Date  . MI (mitral incompetence)   . Hypertension   . 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 Gout    Past Surgical History  Procedure Laterality Date  . Kidney surgery    . Cardiac surgery      CABG in 1996   No family history on file. Social History  Substance Use Topics  . Smoking status: Former Research scientist (life sciences)  . Smokeless tobacco: None     Comment: quit in 1972  . Alcohol Use: No    Review of Systems  Constitutional: Negative for fever, chills, diaphoresis, appetite change and fatigue.  HENT: Positive for congestion. Negative for mouth sores, sore throat and trouble swallowing.   Eyes: Negative for visual disturbance.  Respiratory: Positive for cough. Negative for chest tightness, shortness of breath and wheezing.   Cardiovascular: Negative for chest pain.  Gastrointestinal: Negative for nausea, vomiting, abdominal pain, diarrhea and abdominal distention.  Endocrine: Negative for polydipsia, polyphagia and polyuria.   Genitourinary: Negative for dysuria, frequency and hematuria.  Musculoskeletal: Negative for gait problem.  Skin: Negative for color change, pallor and rash.  Neurological: Negative for dizziness, syncope, light-headedness and headaches.  Hematological: Does not bruise/bleed easily.  Psychiatric/Behavioral: Negative for behavioral problems and confusion.    Allergies  Penicillins  Home Medications   Prior to Admission medications   Medication Sig Start Date End Date Taking? Authorizing Provider  allopurinol (ZYLOPRIM) 100 MG tablet Take 100 mg by mouth daily.   Yes Historical Provider, MD  aspirin EC 81 MG tablet Take 81 mg by mouth daily.   Yes Historical Provider, MD  atorvastatin (LIPITOR) 40 MG tablet Take 40 mg by mouth daily.   Yes Historical Provider, MD  benazepril (LOTENSIN) 10 MG tablet Take 10 mg by mouth daily.   Yes Historical Provider, MD  fish oil-omega-3 fatty acids 1000 MG capsule Take 1 g by mouth 2 (two) times daily.   Yes Historical Provider, MD  nitroGLYCERIN (NITROSTAT) 0.4 MG SL tablet Place 1 tablet (0.4 mg total) under the tongue every 5 (five) minutes as needed for chest pain (3 tablets maximum). 01/15/12  Yes Delfina Redwood, MD  benzonatate (TESSALON) 100 MG capsule Take 1 capsule (100 mg total) by mouth every 8 (eight) hours. 05/22/15   Tanna Furry, MD  ondansetron (ZOFRAN ODT) 4 MG disintegrating tablet Take 1 tablet (4 mg total) by mouth every 8 (eight) hours as needed for nausea. 05/22/15   Tanna Furry, MD   Triage Vitals: BP 160/91 mmHg  Pulse 86  Temp(Src) 98.2 F (36.8 C) (Oral)  Resp 16  Wt 150 lb (68.04 kg)  SpO2 100% Physical Exam  Constitutional: He is oriented to person, place, and time. He appears well-developed and well-nourished. No distress.  HENT:  Head: Normocephalic.  Eyes: Conjunctivae are normal. Pupils are equal, round, and reactive to light. No scleral icterus.  Neck: Normal range of motion. Neck supple. No thyromegaly present.   Cardiovascular: Normal rate and regular rhythm.  Exam reveals no gallop and no friction rub.   No murmur heard. Pulmonary/Chest: Effort normal. No respiratory distress. He has no wheezes. He has rhonchi (left base). He has no rales.  Abdominal: Soft. Bowel sounds are normal. He exhibits no distension. There is no tenderness. There is no rebound.  Musculoskeletal: Normal range of motion.  Neurological: He is alert and oriented to person, place, and time.  Skin: Skin is warm and dry. No rash noted.  Psychiatric: He has a normal mood and affect. His behavior is normal.    ED Course  Procedures (including critical care time) DIAGNOSTIC STUDIES: Oxygen Saturation is 100% on RA, normal by my interpretation.   COORDINATION OF CARE: 12:10 PM- Will order CXR. Pt verbalizes understanding and agrees to plan.  Medications - No data to display  Labs Review Labs Reviewed - No data to display  Imaging Review Dg Chest 2 View  05/22/2015  CLINICAL DATA:  Pt comes in with nasal congestion and cough starting 3 days ago. Pt states he has had several episodes of emesis after coughing but has been able to keep fluids down. Denies any diarrhea. EXAM: CHEST - 2 VIEW COMPARISON:  05/26/2014 FINDINGS: Previous CABG. Lungs hyperinflated, with somewhat coarse parenchymal markings, no new infiltrate or overt edema. Heart size normal. Tortuous atheromatous aorta. No effusion.  No pneumothorax. Visualized skeletal structures are unremarkable. IMPRESSION: 1. Stable chest post CABG.  No acute disease. Electronically Signed   By: Lucrezia Europe M.D.   On: 05/22/2015 11:24   I have personally reviewed and evaluated these images and lab results as part of my medical decision-making.   EKG Interpretation None      MDM   Final diagnoses:  URI (upper respiratory infection)    Few scattered rhonchi that clear with cough. Not hypoxemic or febrile. Normal chest x-ray. Does not appear septic or toxic. Plan is  anti-medic, and I tested. Rest symptomatically treatment, expectant management.  I personally performed the services described in this documentation, which was scribed in my presence. The recorded information has been reviewed and is accurate.     Tanna Furry, MD 05/22/15 [phone]

## 2015-05-22 NOTE — ED Notes (Signed)
Pt comes in with nasal congestion and cough starting 3 days ago. Pt states he has had several episodes of emesis after coughing but has been able to keep fluids down. Denies any diarrhea.

## 2015-05-22 NOTE — Discharge Instructions (Signed)
Upper Respiratory Infection, Adult Most upper respiratory infections (URIs) are a viral infection of the air passages leading to the lungs. A URI affects the nose, throat, and upper air passages. The most common type of URI is nasopharyngitis and is typically referred to as "the common cold." URIs run their course and usually go away on their own. Most of the time, a URI does not require medical attention, but sometimes a bacterial infection in the upper airways can follow a viral infection. This is called a secondary infection. Sinus and middle ear infections are common types of secondary upper respiratory infections. Bacterial pneumonia can also complicate a URI. A URI can worsen asthma and chronic obstructive pulmonary disease (COPD). Sometimes, these complications can require emergency medical care and may be life threatening.  CAUSES Almost all URIs are caused by viruses. A virus is a type of germ and can spread from one person to another.  RISKS FACTORS You may be at risk for a URI if:   You smoke.   You have chronic heart or lung disease.  You have a weakened defense (immune) system.   You are very young or very old.   You have nasal allergies or asthma.  You work in crowded or poorly ventilated areas.  You work in health care facilities or schools. SIGNS AND SYMPTOMS  Symptoms typically develop 2-3 days after you come in contact with a cold virus. Most viral URIs last 7-10 days. However, viral URIs from the influenza virus (flu virus) can last 14-18 days and are typically more severe. Symptoms may include:   Runny or stuffy (congested) nose.   Sneezing.   Cough.   Sore throat.   Headache.   Fatigue.   Fever.   Loss of appetite.   Pain in your forehead, behind your eyes, and over your cheekbones (sinus pain).  Muscle aches.  DIAGNOSIS  Your health care provider may diagnose a URI by:  Physical exam.  Tests to check that your symptoms are not due to  another condition such as:  Strep throat.  Sinusitis.  Pneumonia.  Asthma. TREATMENT  A URI goes away on its own with time. It cannot be cured with medicines, but medicines may be prescribed or recommended to relieve symptoms. Medicines may help:  Reduce your fever.  Reduce your cough.  Relieve nasal congestion. HOME CARE INSTRUCTIONS   Take medicines only as directed by your health care provider.   Gargle warm saltwater or take cough drops to comfort your throat as directed by your health care provider.  Use a warm mist humidifier or inhale steam from a shower to increase air moisture. This may make it easier to breathe.  Drink enough fluid to keep your urine clear or pale yellow.   Eat soups and other clear broths and maintain good nutrition.   Rest as needed.   Return to work when your temperature has returned to normal or as your health care provider advises. You may need to stay home longer to avoid infecting others. You can also use a face mask and careful hand washing to prevent spread of the virus.  Increase the usage of your inhaler if you have asthma.   Do not use any tobacco products, including cigarettes, chewing tobacco, or electronic cigarettes. If you need help quitting, ask your health care provider. PREVENTION  The best way to protect yourself from getting a cold is to practice good hygiene.   Avoid oral or hand contact with people with cold   symptoms.   Wash your hands often if contact occurs.  There is no clear evidence that vitamin C, vitamin E, echinacea, or exercise reduces the chance of developing a cold. However, it is always recommended to get plenty of rest, exercise, and practice good nutrition.  SEEK MEDICAL CARE IF:   You are getting worse rather than better.   Your symptoms are not controlled by medicine.   You have chills.  You have worsening shortness of breath.  You have brown or red mucus.  You have yellow or brown nasal  discharge.  You have pain in your face, especially when you bend forward.  You have a fever.  You have swollen neck glands.  You have pain while swallowing.  You have white areas in the back of your throat. SEEK IMMEDIATE MEDICAL CARE IF:   You have severe or persistent:  Headache.  Ear pain.  Sinus pain.  Chest pain.  You have chronic lung disease and any of the following:  Wheezing.  Prolonged cough.  Coughing up blood.  A change in your usual mucus.  You have a stiff neck.  You have changes in your:  Vision.  Hearing.  Thinking.  Mood. MAKE SURE YOU:   Understand these instructions.  Will watch your condition.  Will get help right away if you are not doing well or get worse.   This information is not intended to replace advice given to you by your health care provider. Make sure you discuss any questions you have with your health care provider.   Document Released: 11/06/2000 Document Revised: 09/27/2014 Document Reviewed: 08/18/2013 Elsevier Interactive Patient Education 2016 [employer]  

## 2015-05-31 ENCOUNTER — Encounter (HOSPITAL_COMMUNITY): Payer: Self-pay | Admitting: Internal Medicine

## 2016-01-09 ENCOUNTER — Emergency Department (HOSPITAL_COMMUNITY)
Admission: EM | Admit: 2016-01-09 | Discharge: 2016-01-09 | Disposition: A | Discharge status: Home / Self Care | Payer: Medicare Other | Attending: Emergency Medicine | Admitting: Emergency Medicine

## 2016-01-09 ENCOUNTER — Emergency Department (HOSPITAL_COMMUNITY): Payer: Medicare Other

## 2016-01-09 ENCOUNTER — Encounter (HOSPITAL_COMMUNITY): Payer: Self-pay | Admitting: Emergency Medicine

## 2016-01-09 DIAGNOSIS — I2510 Atherosclerotic heart disease of native coronary artery without angina pectoris: Secondary | ICD-10-CM | POA: Insufficient documentation

## 2016-01-09 DIAGNOSIS — I10 Essential (primary) hypertension: Secondary | ICD-10-CM | POA: Insufficient documentation

## 2016-01-09 DIAGNOSIS — M25531 Pain in right wrist: Secondary | ICD-10-CM | POA: Diagnosis present

## 2016-01-09 DIAGNOSIS — Z87891 Personal history of nicotine dependence: Secondary | ICD-10-CM | POA: Diagnosis not present

## 2016-01-09 DIAGNOSIS — M10031 Idiopathic gout, right wrist: Secondary | ICD-10-CM | POA: Insufficient documentation

## 2016-01-09 DIAGNOSIS — M109 Gout, unspecified: Secondary | ICD-10-CM

## 2016-01-09 DIAGNOSIS — Z8546 Personal history of malignant neoplasm of prostate: Secondary | ICD-10-CM | POA: Diagnosis not present

## 2016-01-09 HISTORY — DX: Malignant (primary) neoplasm, unspecified: C80.1

## 2016-01-09 HISTORY — DX: Malignant neoplasm of prostate: C61

## 2016-01-09 LAB — I-STAT CHEM 8, ED
BUN: 14 mg/dL (ref 6–20)
Calcium, Ion: 1.18 mmol/L (ref 1.12–1.23)
Chloride: 96 mmol/L — ABNORMAL LOW (ref 101–111)
Creatinine, Ser: 1.2 mg/dL (ref 0.61–1.24)
Glucose, Bld: 121 mg/dL — ABNORMAL HIGH (ref 65–99)
HEMATOCRIT: 40 % (ref 39.0–52.0)
HEMOGLOBIN: 13.6 g/dL (ref 13.0–17.0)
Potassium: 4.5 mmol/L (ref 3.5–5.1)
SODIUM: 134 mmol/L — AB (ref 135–145)
TCO2: 26 mmol/L (ref 0–100)

## 2016-01-09 MED ORDER — PREDNISONE 20 MG PO TABS
40.0000 mg | ORAL_TABLET | Freq: Once | ORAL | Status: AC
  Administered 2016-01-09: 40 mg via ORAL
  Filled 2016-01-09: qty 2

## 2016-01-09 MED ORDER — PREDNISONE 10 MG PO TABS: 20.0000 mg | ORAL_TABLET | Freq: Every day | ORAL | 0 refills | Status: DC

## 2016-01-09 MED ORDER — OXYCODONE-ACETAMINOPHEN 5-325 MG PO TABS
1.0000 | ORAL_TABLET | Freq: Once | ORAL | Status: AC
  Administered 2016-01-09: 1 via ORAL
  Filled 2016-01-09: qty 1

## 2016-01-09 MED ORDER — OXYCODONE-ACETAMINOPHEN 5-325 MG PO TABS: 1.0000 | ORAL_TABLET | Freq: Four times a day (QID) | ORAL | 0 refills | Status: DC | PRN

## 2016-01-09 NOTE — Discharge Instructions (Signed)
Stop taking tramadol.  You will be given a Rx for percocet and prednisone.  If you develop worsening redness, fever or pain , you need to be reevaluated.

## 2016-01-09 NOTE — ED Triage Notes (Signed)
Pt with swelling to R hand that started 2 days ago. Pt denies injury. States he was seen at [facility] yesterday and was given script for Ultram for pain but pt states they are not helping.

## 2016-01-09 NOTE — ED Provider Notes (Signed)
[facility] DEPT Provider Note   CSN: [id number] Arrival date & time: 01/09/16  8466     History   Chief Complaint Chief Complaint  Patient presents with  . Hand Swelling    HPI NAZIM KADLEC is a 82 y.o. male.  HPI  This is an 82 year old male with history of hypertension, cancer, and gout who presents with right wrist pain and swelling. Patient reports 2 day history of progressive right wrist pain and swelling. Reports that he was seen in an outside hospital and "they just give me those pills."  His unsure of the diagnosis. He states that the tramadol that he was given is not working. Denies any injury. Has a history of gout but that normally is in his feet. He has however had in his wrist previously. Denies any fevers. Currently he rates his pain at 10 out of 10. It is worse with range of motion.  Past Medical History:  Diagnosis Date  . Cancer ([place])   . Gout   . Hypertension   . MI (mitral incompetence)   . Prostate cancer ([place])   . 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 Chest pain 01/14/2012  . CAD (coronary artery disease) 01/14/2012  . 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 Hypertension     Past Surgical History:  Procedure Laterality Date  . CARDIAC SURGERY     CABG in 1996  . COLONOSCOPY N/A 05/17/2015   Procedure: COLONOSCOPY;  Surgeon: Rogene Houston, MD;  Location: [facility];  Service: Endoscopy;  Laterality: N/A;  1:55  . KIDNEY SURGERY         Home Medications    Prior to Admission medications   Medication Sig Start Date End Date Taking? Authorizing Provider  allopurinol (ZYLOPRIM) 100 MG tablet Take 100 mg by mouth daily.    Historical Provider, MD  aspirin EC 81 MG tablet Take 81 mg by mouth daily.    Historical Provider, MD  atorvastatin (LIPITOR) 40 MG tablet Take 40 mg by mouth daily.    Historical Provider, MD  benazepril (LOTENSIN) 10 MG tablet Take 10 mg by  mouth daily.    Historical Provider, MD  benzonatate (TESSALON) 100 MG capsule Take 1 capsule (100 mg total) by mouth every 8 (eight) hours. 05/22/15   Tanna Furry, MD  fish oil-omega-3 fatty acids 1000 MG capsule Take 1 g by mouth 2 (two) times daily.    Historical Provider, MD  nitroGLYCERIN (NITROSTAT) 0.4 MG SL tablet Place 1 tablet (0.4 mg total) under the tongue every 5 (five) minutes as needed for chest pain (3 tablets maximum). 01/15/12   Delfina Redwood, MD  ondansetron (ZOFRAN ODT) 4 MG disintegrating tablet Take 1 tablet (4 mg total) by mouth every 8 (eight) hours as needed for nausea. 05/22/15   Tanna Furry, MD  oxyCODONE-acetaminophen (PERCOCET/ROXICET) 5-325 MG tablet Take 1 tablet by mouth every 6 (six) hours as needed for severe pain. 01/09/16   Merryl Hacker, MD  predniSONE (DELTASONE) 10 MG tablet Take 2 tablets (20 mg total) by mouth daily. 01/09/16   Merryl Hacker, MD    Family History No family history on file.  Social History Social History  Substance Use Topics  . Smoking status: Former Research scientist (life sciences)  . Smokeless tobacco: Never Used     Comment: quit in 1972  . Alcohol use No     Allergies  Penicillins   Review of Systems Review of Systems  Constitutional: Negative for fever.  Musculoskeletal:       Wrist swelling  Skin: Negative for color change.  All other systems reviewed and are negative.    Physical Exam Updated Vital Signs BP (!) 167/101   Pulse 75   Temp 98.3 F (36.8 C) (Oral)   Resp 20   Ht '5\' 6"'$  (1.676 m)   Wt 150 lb (68 kg)   SpO2 99%   BMI 24.21 kg/m   Physical Exam  Constitutional: He is oriented to person, place, and time. He appears well-developed and well-nourished.  Elderly  HENT:  Head: Normocephalic and atraumatic.  Cardiovascular: Normal rate and regular rhythm.   No murmur heard. Pulmonary/Chest: Effort normal. No respiratory distress.  Musculoskeletal: He exhibits edema.  Focused examination of the right wrist  reveals swelling about the wrist without obvious deformity, no erythema, mild warmth, normal range of motion both passive and actively, 2+ radial pulse  Lymphadenopathy:    He has no cervical adenopathy.  Neurological: He is alert and oriented to person, place, and time.  Skin: Skin is warm and dry.  Psychiatric: He has a normal mood and affect.  Nursing note and vitals reviewed.    ED Treatments / Results  Labs (all labs ordered are listed, but only abnormal results are displayed) Labs Reviewed  I-STAT CHEM 8, ED - Abnormal; Notable for the following:       Result Value   Sodium 134 (*)    Chloride 96 (*)    Glucose, Bld 121 (*)    All other components within normal limits    EKG  EKG Interpretation None       Radiology Dg Wrist Complete Right  Result Date: 01/09/2016 CLINICAL DATA:  Swelling and more to RIGHT wrist beginning 1 day ago, no known injury, history gout, prostate cancer, hypertension, post renal transplant with transplant failure EXAM: RIGHT WRIST - COMPLETE 3+ VIEW COMPARISON:  None FINDINGS: Osseous demineralization. Upper normal caliber scapholunate interval. Joint spaces otherwise preserved. Soft tissue swelling greatest at dorsum of wrist. Mild chondrocalcinosis is identified question CPPD. No acute fracture, dislocation, or bone destruction. IMPRESSION: Chondrocalcinosis question CPPD. Osseous demineralization. Soft tissue swelling without definite acute bony abnormalities. Electronically Signed   By: Lavonia Dana M.D.   On: 01/09/2016 07:57    Procedures Procedures (including critical care time)  Medications Ordered in ED Medications  oxyCODONE-acetaminophen (PERCOCET/ROXICET) 5-325 MG per tablet 1 tablet (1 tablet Oral Given 01/09/16 0720)  predniSONE (DELTASONE) tablet 40 mg (40 mg Oral Given 01/09/16 0805)     Initial Impression / Assessment and Plan / ED Course  I have reviewed the triage vital signs and the nursing notes.  Pertinent labs &  imaging results that were available during my care of the patient were reviewed by me and considered in my medical decision making (see chart for details).  Clinical Course    Patient presents with worsening right wrist pain over the last 2 days. History of gout. On exam he has diffuse swelling of the right wrist, mild warmth, no significant overlying erythema. He's been afebrile. Suspect gouty flare. He does have a history of renal dysfunction. Patient was given Percocet and prednisone. X-ray consistent with likely gout versus pseudogout. Doubt septic joint. Will discharge home with a burst dose of steroids and change pain medication from tramadol Percocet. Patient was instructed to not take tramadol and Percocet together. If he has worsening symptoms, fever  or any new symptoms, he should be reevaluated.  Final Clinical Impressions(s) / ED Diagnoses   Final diagnoses:  Acute gout of right wrist, unspecified cause    New Prescriptions Discharge Medication List as of 01/09/2016  8:01 AM    START taking these medications   Details  oxyCODONE-acetaminophen (PERCOCET/ROXICET) 5-325 MG tablet Take 1 tablet by mouth every 6 (six) hours as needed for severe pain., Starting Tue 01/09/2016, Print    predniSONE (DELTASONE) 10 MG tablet Take 2 tablets (20 mg total) by mouth daily., Starting Tue 01/09/2016, Print         Merryl Hacker, MD 01/10/16 [phone]

## 2021-06-07 ENCOUNTER — Emergency Department (HOSPITAL_COMMUNITY): Payer: Medicare Other

## 2021-06-07 ENCOUNTER — Observation Stay (HOSPITAL_COMMUNITY): Payer: Medicare Other

## 2021-06-07 ENCOUNTER — Inpatient Hospital Stay (HOSPITAL_COMMUNITY)
Admission: EM | Admit: 2021-06-07 | Discharge: 2021-06-09 | DRG: 181 | Disposition: A | Discharge status: Home / Self Care | Payer: Medicare Other | Attending: Internal Medicine | Admitting: Internal Medicine

## 2021-06-07 ENCOUNTER — Other Ambulatory Visit: Payer: Self-pay

## 2021-06-07 ENCOUNTER — Encounter (HOSPITAL_COMMUNITY): Payer: Self-pay

## 2021-06-07 DIAGNOSIS — I130 Hypertensive heart and chronic kidney disease with heart failure and stage 1 through stage 4 chronic kidney disease, or unspecified chronic kidney disease: Secondary | ICD-10-CM | POA: Diagnosis present

## 2021-06-07 DIAGNOSIS — Z681 Body mass index (BMI) 19 or less, adult: Secondary | ICD-10-CM

## 2021-06-07 DIAGNOSIS — R0789 Other chest pain: Secondary | ICD-10-CM | POA: Diagnosis present

## 2021-06-07 DIAGNOSIS — R079 Chest pain, unspecified: Secondary | ICD-10-CM | POA: Diagnosis not present

## 2021-06-07 DIAGNOSIS — I25119 Atherosclerotic heart disease of native coronary artery with unspecified angina pectoris: Secondary | ICD-10-CM

## 2021-06-07 DIAGNOSIS — C781 Secondary malignant neoplasm of mediastinum: Secondary | ICD-10-CM | POA: Diagnosis not present

## 2021-06-07 DIAGNOSIS — Z940 Kidney transplant status: Secondary | ICD-10-CM

## 2021-06-07 DIAGNOSIS — E440 Moderate protein-calorie malnutrition: Secondary | ICD-10-CM | POA: Insufficient documentation

## 2021-06-07 DIAGNOSIS — Z9049 Acquired absence of other specified parts of digestive tract: Secondary | ICD-10-CM

## 2021-06-07 DIAGNOSIS — Z8509 Personal history of malignant neoplasm of other digestive organs: Secondary | ICD-10-CM

## 2021-06-07 DIAGNOSIS — Z905 Acquired absence of kidney: Secondary | ICD-10-CM

## 2021-06-07 DIAGNOSIS — N1832 Chronic kidney disease, stage 3b: Secondary | ICD-10-CM | POA: Diagnosis present

## 2021-06-07 DIAGNOSIS — Z951 Presence of aortocoronary bypass graft: Secondary | ICD-10-CM

## 2021-06-07 DIAGNOSIS — I2510 Atherosclerotic heart disease of native coronary artery without angina pectoris: Secondary | ICD-10-CM | POA: Diagnosis present

## 2021-06-07 DIAGNOSIS — E782 Mixed hyperlipidemia: Secondary | ICD-10-CM

## 2021-06-07 DIAGNOSIS — Z923 Personal history of irradiation: Secondary | ICD-10-CM

## 2021-06-07 DIAGNOSIS — I255 Ischemic cardiomyopathy: Secondary | ICD-10-CM | POA: Diagnosis not present

## 2021-06-07 DIAGNOSIS — Z8546 Personal history of malignant neoplasm of prostate: Secondary | ICD-10-CM

## 2021-06-07 DIAGNOSIS — Z90411 Acquired partial absence of pancreas: Secondary | ICD-10-CM

## 2021-06-07 DIAGNOSIS — Z833 Family history of diabetes mellitus: Secondary | ICD-10-CM

## 2021-06-07 DIAGNOSIS — Z20822 Contact with and (suspected) exposure to covid-19: Secondary | ICD-10-CM | POA: Diagnosis present

## 2021-06-07 DIAGNOSIS — N183 Chronic kidney disease, stage 3 unspecified: Secondary | ICD-10-CM

## 2021-06-07 DIAGNOSIS — I5022 Chronic systolic (congestive) heart failure: Secondary | ICD-10-CM | POA: Diagnosis present

## 2021-06-07 DIAGNOSIS — C3490 Malignant neoplasm of unspecified part of unspecified bronchus or lung: Secondary | ICD-10-CM | POA: Diagnosis not present

## 2021-06-07 DIAGNOSIS — I10 Essential (primary) hypertension: Secondary | ICD-10-CM

## 2021-06-07 DIAGNOSIS — M109 Gout, unspecified: Secondary | ICD-10-CM | POA: Diagnosis present

## 2021-06-07 DIAGNOSIS — Z87891 Personal history of nicotine dependence: Secondary | ICD-10-CM

## 2021-06-07 DIAGNOSIS — Z85118 Personal history of other malignant neoplasm of bronchus and lung: Secondary | ICD-10-CM

## 2021-06-07 HISTORY — DX: Mixed hyperlipidemia: E78.2

## 2021-06-07 HISTORY — DX: Ischemic cardiomyopathy: I25.5

## 2021-06-07 HISTORY — DX: Atherosclerotic heart disease of native coronary artery without angina pectoris: I25.10

## 2021-06-07 HISTORY — DX: Malignant neoplasm of unspecified part of unspecified bronchus or lung: C34.90

## 2021-06-07 HISTORY — DX: Acquired absence of kidney: Z90.5

## 2021-06-07 HISTORY — DX: Malignant neoplasm of ampulla of Vater: C24.1

## 2021-06-07 HISTORY — DX: Chronic kidney disease, stage 3 unspecified: N18.30

## 2021-06-07 HISTORY — DX: Essential (primary) hypertension: I10

## 2021-06-07 LAB — BASIC METABOLIC PANEL
Anion gap: 8 (ref 5–15)
BUN: 20 mg/dL (ref 8–23)
CO2: 23 mmol/L (ref 22–32)
Calcium: 8.8 mg/dL — ABNORMAL LOW (ref 8.9–10.3)
Chloride: 106 mmol/L (ref 98–111)
Creatinine, Ser: 1.57 mg/dL — ABNORMAL HIGH (ref 0.61–1.24)
GFR, Estimated: 43 mL/min — ABNORMAL LOW (ref >60)
Glucose, Bld: 108 mg/dL — ABNORMAL HIGH (ref 70–99)
Potassium: 4 mmol/L (ref 3.5–5.1)
Sodium: 137 mmol/L (ref 135–145)

## 2021-06-07 LAB — CBC WITH DIFFERENTIAL/PLATELET
Abs Immature Granulocytes: 0.01 10*3/uL (ref 0.00–0.07)
Basophils Absolute: 0 10*3/uL (ref 0.0–0.1)
Basophils Relative: 1 %
Eosinophils Absolute: 0.1 10*3/uL (ref 0.0–0.5)
Eosinophils Relative: 2 %
HCT: 43.2 % (ref 39.0–52.0)
Hemoglobin: 13.8 g/dL (ref 13.0–17.0)
Immature Granulocytes: 0 %
Lymphocytes Relative: 15 %
Lymphs Abs: 0.6 10*3/uL — ABNORMAL LOW (ref 0.7–4.0)
MCH: 30.3 pg (ref 26.0–34.0)
MCHC: 31.9 g/dL (ref 30.0–36.0)
MCV: 94.7 fL (ref 80.0–100.0)
Monocytes Absolute: 0.3 10*3/uL (ref 0.1–1.0)
Monocytes Relative: 7 %
Neutro Abs: 3.2 10*3/uL (ref 1.7–7.7)
Neutrophils Relative %: 75 %
Platelets: 111 10*3/uL — ABNORMAL LOW (ref 150–400)
RBC: 4.56 MIL/uL (ref 4.22–5.81)
RDW: 14.1 % (ref 11.5–15.5)
WBC: 4.2 10*3/uL (ref 4.0–10.5)
nRBC: 0 % (ref 0.0–0.2)

## 2021-06-07 LAB — CBG MONITORING, ED: Glucose-Capillary: 82 mg/dL (ref 70–99)

## 2021-06-07 LAB — RESP PANEL BY RT-PCR (FLU A&B, COVID) ARPGX2
Influenza A by PCR: NEGATIVE
Influenza B by PCR: NEGATIVE
SARS Coronavirus 2 by RT PCR: NEGATIVE

## 2021-06-07 LAB — TROPONIN I (HIGH SENSITIVITY)
Troponin I (High Sensitivity): 11 ng/L (ref <18)
Troponin I (High Sensitivity): 12 ng/L (ref <18)

## 2021-06-07 LAB — D-DIMER, QUANTITATIVE: D-Dimer, Quant: 1.03 ug/mL-FEU — ABNORMAL HIGH (ref 0.00–0.50)

## 2021-06-07 LAB — LIPASE, BLOOD: Lipase: 38 U/L (ref 11–51)

## 2021-06-07 MED ORDER — HYDRALAZINE HCL 20 MG/ML IJ SOLN
10.0000 mg | Freq: Once | INTRAMUSCULAR | Status: AC
  Administered 2021-06-07: 10 mg via INTRAVENOUS
  Filled 2021-06-07: qty 1

## 2021-06-07 MED ORDER — EMPAGLIFLOZIN 10 MG PO TABS
10.0000 mg | ORAL_TABLET | Freq: Every day | ORAL | Status: DC
  Administered 2021-06-08 – 2021-06-09 (×2): 10 mg via ORAL
  Filled 2021-06-07 (×5): qty 1

## 2021-06-07 MED ORDER — HYDRALAZINE HCL 25 MG PO TABS
25.0000 mg | ORAL_TABLET | Freq: Three times a day (TID) | ORAL | Status: DC
  Administered 2021-06-07 – 2021-06-09 (×6): 25 mg via ORAL
  Filled 2021-06-07 (×6): qty 1

## 2021-06-07 MED ORDER — DARIFENACIN HYDROBROMIDE ER 7.5 MG PO TB24
7.5000 mg | ORAL_TABLET | Freq: Every day | ORAL | Status: DC
  Administered 2021-06-07 – 2021-06-09 (×3): 7.5 mg via ORAL
  Filled 2021-06-07 (×3): qty 1

## 2021-06-07 MED ORDER — ACETAMINOPHEN 650 MG RE SUPP: 650.0000 mg | Freq: Four times a day (QID) | RECTAL | Status: DC | PRN

## 2021-06-07 MED ORDER — ENOXAPARIN SODIUM 30 MG/0.3ML IJ SOSY
30.0000 mg | PREFILLED_SYRINGE | INTRAMUSCULAR | Status: DC
  Administered 2021-06-07 – 2021-06-08 (×2): 30 mg via SUBCUTANEOUS
  Filled 2021-06-07 (×2): qty 0.3

## 2021-06-07 MED ORDER — ATORVASTATIN CALCIUM 40 MG PO TABS
40.0000 mg | ORAL_TABLET | Freq: Every day | ORAL | Status: DC
  Administered 2021-06-07 – 2021-06-08 (×2): 40 mg via ORAL
  Filled 2021-06-07 (×2): qty 1

## 2021-06-07 MED ORDER — ENSURE ENLIVE PO LIQD
237.0000 mL | Freq: Two times a day (BID) | ORAL | Status: DC
  Administered 2021-06-08 – 2021-06-09 (×2): 237 mL via ORAL

## 2021-06-07 MED ORDER — ONDANSETRON HCL 4 MG/2ML IJ SOLN: 4.0000 mg | Freq: Four times a day (QID) | INTRAMUSCULAR | Status: DC | PRN

## 2021-06-07 MED ORDER — ASPIRIN EC 81 MG PO TBEC
81.0000 mg | DELAYED_RELEASE_TABLET | Freq: Every day | ORAL | Status: DC
  Administered 2021-06-07 – 2021-06-09 (×3): 81 mg via ORAL
  Filled 2021-06-07 (×3): qty 1

## 2021-06-07 MED ORDER — PANTOPRAZOLE SODIUM 40 MG PO TBEC
40.0000 mg | DELAYED_RELEASE_TABLET | Freq: Every day | ORAL | Status: DC
  Administered 2021-06-07 – 2021-06-09 (×3): 40 mg via ORAL
  Filled 2021-06-07 (×3): qty 1

## 2021-06-07 MED ORDER — METOPROLOL SUCCINATE ER 25 MG PO TB24
25.0000 mg | ORAL_TABLET | Freq: Every day | ORAL | Status: DC
  Administered 2021-06-07 – 2021-06-09 (×3): 25 mg via ORAL
  Filled 2021-06-07 (×3): qty 1

## 2021-06-07 MED ORDER — SODIUM CHLORIDE 0.9 % IV SOLN: INTRAVENOUS | Status: AC

## 2021-06-07 MED ORDER — IOHEXOL 350 MG/ML SOLN
75.0000 mL | Freq: Once | INTRAVENOUS | Status: AC | PRN
  Administered 2021-06-07: 75 mL via INTRAVENOUS

## 2021-06-07 MED ORDER — ONDANSETRON HCL 4 MG PO TABS: 4.0000 mg | ORAL_TABLET | Freq: Four times a day (QID) | ORAL | Status: DC | PRN

## 2021-06-07 MED ORDER — ACETAMINOPHEN 325 MG PO TABS: 650.0000 mg | ORAL_TABLET | Freq: Four times a day (QID) | ORAL | Status: DC | PRN

## 2021-06-07 MED ORDER — ALLOPURINOL 100 MG PO TABS
100.0000 mg | ORAL_TABLET | Freq: Every day | ORAL | Status: DC
  Administered 2021-06-07 – 2021-06-09 (×3): 100 mg via ORAL
  Filled 2021-06-07 (×3): qty 1

## 2021-06-07 NOTE — Progress Notes (Addendum)
Called by radiologist (Dr. Maryelizabeth Kaufmann) regarding CTA chest.   CTA chest at 1627 negative for PE but there is concern for possible descending aortic dissection or intramural hematoma vs mixing artifact -Dr. Maryelizabeth Kaufmann recommended ordering repeat CTA chest with aortic dissection protocol to clarfy -despite risks of contrast in setting of CKD, benefit significantly outweighs risk given possible life threatening process. -CT report and risks/benefits/alternatives of repeat CTA now was discussed with patient's daughters>>>they expressed understanding  and accept the risk of renal failure in light of possible life threatening aortic dissection -daughters stated "you do what you do what you need to do to save his life" and stated to proceed with repeat CTA  Shanon Brow Akshita Italiano

## 2021-06-07 NOTE — ED Provider Notes (Signed)
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 male.  Patient is an 88 y.o. M who has been having chest pain for the last several days that has been getting worse. Denies any shortness of breath or leg swelling. Says that the pain comes and goes and lasts for about 20 minutes at a time.  Denies leg swelling, diaphoresis, or nausea.       Home Medications Prior to Admission medications   Medication Sig Start Date End Date Taking? Authorizing Provider  allopurinol (ZYLOPRIM) 100 MG tablet Take 100 mg by mouth daily.    [provider]  aspirin EC 81 MG tablet Take 81 mg by mouth daily.    [provider]  atorvastatin (LIPITOR) 40 MG tablet Take 40 mg by mouth daily.    [provider]  benazepril (LOTENSIN) 10 MG tablet Take 10 mg by mouth daily.    [provider]  benzonatate (TESSALON) 100 MG capsule Take 1 capsule (100 mg total) by mouth every 8 (eight) hours. 05/22/15   Tanna Furry, MD  fish oil-omega-3 fatty acids 1000 MG capsule Take 1 g by mouth 2 (two) times daily.    [provider]  nitroGLYCERIN (NITROSTAT) 0.4 MG SL tablet Place 1 tablet (0.4 mg total) under the tongue every 5 (five) minutes as needed for chest pain (3 tablets maximum). 01/15/12   Delfina Redwood, MD  ondansetron (ZOFRAN ODT) 4 MG disintegrating tablet Take 1 tablet (4 mg total) by mouth every 8 (eight) hours as needed for nausea. 05/22/15   Tanna Furry, MD  oxyCODONE-acetaminophen (PERCOCET/ROXICET) 5-325 MG tablet Take 1 tablet by mouth every 6 (six) hours as needed for severe pain. 01/09/16   Horton, Barbette Hair, MD   predniSONE (DELTASONE) 10 MG tablet Take 2 tablets (20 mg total) by mouth daily. 01/09/16   Horton, Barbette Hair, MD      Allergies    Penicillins    Review of Systems   Review of Systems  Respiratory:  Positive for chest tightness. Negative for shortness of breath.   Cardiovascular:  Positive for chest pain. Negative for leg swelling.  Neurological:  Negative for syncope.   Physical Exam Updated Vital Signs BP (!) 159/88    Pulse 63    Temp (!) 97.5 F (36.4 C) (Oral)    Resp (!) 21    Ht 5\' 6"  (1.676 m)    Wt 68 kg    SpO2 99%    BMI 24.21 kg/m  Physical Exam Constitutional:      Appearance: Normal appearance.  HENT:     Head: Normocephalic and atraumatic.  Eyes:     Extraocular Movements: Extraocular movements intact.  Cardiovascular:     Rate and Rhythm: Normal rate and regular rhythm.     Heart sounds: Murmur heard.  Diastolic murmur is present with a grade of 2/4.  Pulmonary:     Effort: Pulmonary effort is normal.     Breath sounds: Normal breath sounds.  Abdominal:     General: Abdomen is flat.  Palpations: Abdomen is soft.  Musculoskeletal:     Right lower leg: No edema.     Left lower leg: No edema.  Skin:    General: Skin is warm and dry.  Neurological:     General: No focal deficit present.     Mental Status: He is alert. Mental status is at baseline.    ED Results / Procedures / Treatments   Labs (all labs ordered are listed, but only abnormal results are displayed) Labs Reviewed  CBC WITH DIFFERENTIAL/PLATELET - Abnormal; Notable for the following components:      Result Value   Platelets 111 (*)    Lymphs Abs 0.6 (*)    All other components within normal limits  BASIC METABOLIC PANEL - Abnormal; Notable for the following components:   Glucose, Bld 108 (*)    Creatinine, Ser 1.57 (*)    Calcium 8.8 (*)    GFR, Estimated 43 (*)    All other components within normal limits  RESP PANEL BY RT-PCR (FLU A&B, COVID) ARPGX2  LIPASE, BLOOD  CBG  MONITORING, ED  TROPONIN I (HIGH SENSITIVITY)  TROPONIN I (HIGH SENSITIVITY)    EKG EKG Interpretation  Date/Time:  Thursday June 07 2021 10:48:18 EST Ventricular Rate:  60 PR Interval:  204 QRS Duration: 122 QT Interval:  429 QTC Calculation: 429 R Axis:   70 Text Interpretation: Sinus rhythm Consider left atrial enlargement Left ventricular hypertrophy Nonspecific T abnormalities, lateral leads Baseline wander in lead(s) V6 new t wave inversions inf and lateral Confirmed by Isla Pence [phone]) on 06/07/2021 10:55:51 AM  Radiology DG Chest Portable 1 View  Result Date: 06/07/2021 CLINICAL DATA:  Chest pain EXAM: PORTABLE CHEST 1 VIEW COMPARISON:  05/22/2015 FINDINGS: Sternotomy wires overlie normal cardiac silhouette. Ectatic aorta. Normal pulmonary vasculature. No effusion, infiltrate, or pneumothorax. No acute osseous abnormality. IMPRESSION: 1.  No acute cardiopulmonary process. 2. No change from prior. Electronically Signed   By: Suzy Bouchard M.D.   On: 06/07/2021 11:17    Procedures Procedures    Medications Ordered in ED Medications - No data to display  ED Course/ Medical Decision Making/ A&P                           Medical Decision Making  Patient coming in with chest pain that has been worsening over the last several days. He has a new diastolic murmur at the left sternal border not previously documented. Lab work reviewed by me otherwise unremarkable beside baseline kidney disease. Troponins negative, lipase normal.  ECG did show new T wave inversions. CXR reviewed by me showed no acute disease. Given persistent chest pain and new murmur will consult hospitalist for admission and further workup.    Final Clinical Impression(s) / ED Diagnoses Final diagnoses:  Chest pain, unspecified type    Rx / DC Orders ED Discharge Orders     None         Scarlett Presto, MD 06/07/21 1314    Isla Pence, MD 06/07/21 1353    Isla Pence,  MD 06/07/21 1427

## 2021-06-07 NOTE — Consult Note (Signed)
Cardiology Consultation:   Patient ID: John Shepard; [id number]; 1933-01-16   Admit date: 06/07/2021 Date of Consult: 06/07/2021  Primary Care Provider: [facility] Primary Cardiologist: [facility] Primary Electrophysiologist: None   Patient Profile:   John Shepard is an 88 y.o. male with a history of multivessel CAD status post CABG in 1996, ischemic cardiomyopathy, hypertension, hyperlipidemia, CKD stage III, ampullary carcinoma status post Whipple, and squamous cell lung cancer status post XRT who is being seen today for the evaluation of chest pain at the request of Dr. Carles Collet.  History of Present Illness:   Mr. Don presents to the [facility] ER in referral from urgent care for evaluation of chest discomfort.  He states that symptoms have been present over the last few days.  Specifically, he describes a tightness in his chest that is nonexertional, no obvious precipitant except that he does mention that is worse when he lays down and better if he sits up, also a pleuritic component when he coughs or takes a deep breath.  No productive cough or hemoptysis.  He has a history of multivessel CAD status post CABG in 1996 as discussed below, also ischemic cardiomyopathy.  He has been managed medically through [facility] cardiology.  Echocardiogram from May 2022 reported LVEF approximately 40% with wall motion abnormalities in the inferoseptal/inferior walls, mild diastolic dysfunction, and RVSP estimated 41 mmHg.  Presenting chest x-ray shows no acute process with ectatic aorta, no infiltrates or effusions.  Chest CTA is pending.  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[place])    Status post Whipple   CAD (coronary artery disease)    Status post CABG 1996 - LIMA to LAD, SVG to PDA, SVG to OM 2 at [facility]   CKD (chronic kidney disease) stage 3, GFR 30-59 ml/min (HCC)    Essential hypertension    Gout    Ischemic cardiomyopathy    Mixed  hyperlipidemia    Prostate cancer ([place])    Squamous cell lung cancer [facility])    XRT July 2022   Status post nephrectomy     Past Surgical History:  Procedure Laterality Date   CARDIAC SURGERY     CABG in 1996   COLONOSCOPY N/A 05/17/2015   Procedure: COLONOSCOPY;  Surgeon: Rogene Houston, MD;  Location: [facility];  Service: Endoscopy;  Laterality: N/A;  1:55   KIDNEY SURGERY       Inpatient Medications: Scheduled Meds:  allopurinol  100 mg Oral Daily   aspirin EC  81 mg Oral Daily   atorvastatin  40 mg Oral q1800   darifenacin  7.5 mg Oral Daily   empagliflozin  10 mg Oral Daily   enoxaparin (LOVENOX) injection  30 mg Subcutaneous Q24H   feeding supplement  237 mL Oral BID BM   hydrALAZINE  25 mg Oral Q8H   metoprolol succinate  25 mg Oral Daily   pantoprazole  40 mg Oral Daily    PRN Meds: acetaminophen **OR** acetaminophen, ondansetron **OR** ondansetron (ZOFRAN) IV  Allergies:    Allergies  Allergen Reactions   Penicillins Hives    Has patient had a PCN reaction causing immediate rash, facial/tongue/throat swelling, SOB or lightheadedness with hypotension: Yes Has patient had a PCN reaction causing severe rash involving mucus membranes or skin necrosis: No Has patient had a PCN reaction that required hospitalization No Has patient had a PCN reaction occurring within the last 10 years: No If all of  the above answers are "NO", then may proceed with Cephalosporin use.     Social History:   Social History   Tobacco Use   Smoking status: Former   Smokeless tobacco: Never   Tobacco comments:    quit in 1972  Substance Use Topics   Alcohol use: No    Alcohol/week: 0.0 standard drinks    Family History:   The patient's family history includes Diabetes in his mother; Pneumonia in his father.  ROS:  Please see the history of present illness.  Hearing loss.  All other ROS reviewed and negative.     Physical Exam/Data:   Vitals:   06/07/21 1355 06/07/21  1400 06/07/21 1407 06/07/21 1410  BP:  (!) 165/95  (!) 182/107  Pulse:  62  69  Resp:  (!) 21  20  Temp: 98.2 F (36.8 C)   98.1 F (36.7 C)  TempSrc: Oral   Oral  SpO2:  100%  100%  Weight:   54.1 kg   Height:   5\' 6"  (1.676 m)    No intake or output data in the 24 hours ending 06/07/21 1519 Filed Weights   06/07/21 1046 06/07/21 1407  Weight: 68 kg 54.1 kg   Body mass index is 19.24 kg/m.   Gen: Elderly male in no distress. HEENT: Conjunctiva and lids normal, oropharynx clear. Neck: Supple, no elevated JVP or carotid bruits, no thyromegaly. Lungs: Clear to auscultation, nonlabored breathing at rest. Cardiac: Regular rate and rhythm, no S3, 1/6 systolic murmur, 3/6 diastolic murmur suggestive of aortic regurgitation, no pericardial rub. Abdomen: Soft, nontender, bowel sounds present. Extremities: No pitting edema, distal pulses 2+. Skin: Warm and dry. Musculoskeletal: No kyphosis. Neuropsychiatric: Alert and oriented x3, affect grossly appropriate.  EKG:  An ECG dated 06/07/2021 was personally reviewed today and demonstrated:  Sinus rhythm with left atrial enlargement, LVH and repolarization abnormalities.  Telemetry:  I personally reviewed telemetry which shows sinus rhythm.  Relevant CV Studies:  Echocardiogram 10/21/2020 ([place]): Summary    1. The left ventricle is normal in size with normal wall thickness.    2. The left ventricular systolic function is moderately decreased, LVEF is  visually estimated at 40%.    3. There is decreased contractile function involving the mid inferior, mid  inferoseptal, basal inferior and basal inferoseptal segment(s).    4. There is grade I diastolic dysfunction (impaired relaxation).    5. The right ventricle is normal in size, with normal systolic function.    6. There is mild pulmonary hypertension, estimated pulmonary artery systolic  pressure is 41 mmHg.    7. The aorta at the sinuses of Valsalva and ascending aorta is  mildly  dilated.   Laboratory Data:  Chemistry Recent Labs  Lab 06/07/21 1127  NA 137  K 4.0  CL 106  CO2 23  GLUCOSE 108*  BUN 20  CREATININE 1.57*  CALCIUM 8.8*  GFRNONAA 43*  ANIONGAP 8     Hematology Recent Labs  Lab 06/07/21 1127  WBC 4.2  RBC 4.56  HGB 13.8  HCT 43.2  MCV 94.7  MCH 30.3  MCHC 31.9  RDW 14.1  PLT 111*   Cardiac Enzymes Recent Labs  Lab 06/07/21 1127 06/07/21 1320  TROPONINIHS 11 12    DDimer Recent Labs  Lab 06/07/21 1127  DDIMER 1.03*    Radiology/Studies:  DG Chest Portable 1 View  Result Date: 06/07/2021 CLINICAL DATA:  Chest pain EXAM: PORTABLE CHEST 1 VIEW COMPARISON:  05/22/2015  FINDINGS: Sternotomy wires overlie normal cardiac silhouette. Ectatic aorta. Normal pulmonary vasculature. No effusion, infiltrate, or pneumothorax. No acute osseous abnormality. IMPRESSION: 1.  No acute cardiopulmonary process. 2. No change from prior. Electronically Signed   By: Suzy Bouchard M.D.   On: 06/07/2021 11:17    Assessment and Plan:   1.  Atypical chest pain as discussed above.  Cardiac enzymes do not suggest ACS, ECG shows sinus rhythm with LVH and repolarization abnormalities.  His D-dimer is mildly increased and chest CTA is pending for further assessment.  2.  Multivessel CAD status post CABG in 1996 as outlined above.  Outpatient medication list includes aspirin, Lipitor, Toprol-XL, Jardiance, and as needed nitroglycerin.  3.  Ischemic cardiomyopathy with LVEF approximately 40% by echocardiogram in May 2022 through [facility].  Outpatient medication list includes Entresto, Toprol-XL, Jardiance, Lasix, and Aldactone.  4.  CKD stage IIIb, creatinine 1.57 with normal potassium.  5.  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  6.  Squamous cell lung cancer status post XRT.  Agree with chest CTA.  Also obtain follow-up echocardiogram.  Repeat ECG in a.m.  Without clear evidence of ACS, main focus at this time would be  medical therapy from a cardiac perspective.  We will continue to follow.  Signed, Rozann Lesches, MD  06/07/2021 3:19 PM

## 2021-06-07 NOTE — ED Triage Notes (Signed)
Pt complaining of chest pain for 3 days, pt lives at home was seen at [facility], pt given 3 ASA prior to arrival has been pain free per EMS.  IV in place

## 2021-06-07 NOTE — H&P (Addendum)
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  Patient states that he has been having substernal and left-sided chest pain for the last 2 to 3 days.  He cannot relate any inciting factors.  He states that the pain primarily occurs at rest and is not any worse with exertion.  He denies any fevers, chills, coughing, hemoptysis, nausea, vomiting, diarrhea, dizziness, syncope.  He states that the chest pain lasts anywhere from 20 minutes to 1 hour.  It is sharp in nature without any radiation.  The patient went to urgent care today with chest pain and was directed to the emergency department for further evaluation. ED Patient was afebrile and hemodynamically stable with oxygen saturation 100% room air.  BMP showed sodium 137, potassium 4.0, bicarbonate 23, serum creatinine 1.57.  WBC 4.2, hemoglobin 13.8, platelets 111,000.  EKG shows sinus rhythm with nonspecific ST-T wave changes.  Chest x-ray was negative for any infiltrates.  Initial troponin was 11.  Assessment/Plan:  Chest pain -Appears atypical by clinical history -Finish cycling troponins -Check D-dimer--1.06 -Cardiology consult -TTE 03/30/18 LVEF 45-50% -History of CAD with three-vessel CABG 1996 -obtain CTA chest -repeat echo   CKD stage IIIb -Baseline creatinine 1.2-1.5 -Patient has history of left nephrectomy 1972, patient cannot recall why   Hyperlipidemia -Continue statin -check lipid panel    Essential hypertension -continue metoprolol succinate -Repeat BMP in the morning   Squamous cell carcinoma of the lung -Status post wedge resection January 09, 2018 -Patient likely has had a recurrence--finished palliative RT for mediastinal mass July 2022   Ampullary adenocarcinoma -Status post resection 03/26/2018 -decided to forego adjuvant chemotherapy for his ampullary malignancy after an informed discussion and started on an active surveillance program  -Patient follows at [place] -Continue allopurinol   Chronic systolic CHF --TTE 53/9/76 LVEF 45-50% -clinically euvolemic -continue metoprolol succinate -continue Jardiance -hold Entresto temporarily       Past Medical History:  Diagnosis Date   Cancer ([place])    Gout    Hypertension    MI (mitral incompetence)    Prostate cancer ([place])    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 He has never used smokeless tobacco. He reports that he does not drink alcohol and does not use drugs.   History reviewed. No pertinent family history.   Allergies  Allergen Reactions   Penicillins Hives    Has patient had a PCN reaction causing immediate rash, facial/tongue/throat swelling, SOB or lightheadedness with hypotension: Yes Has patient had a PCN reaction causing severe rash involving mucus membranes or skin necrosis: No Has patient had a PCN reaction  that required hospitalization No Has patient had a PCN reaction occurring within the last 10 years: No If all of the above answers are "NO", then may proceed with Cephalosporin use.      Prior to Admission medications   Medication Sig  Start Date End Date Taking? Authorizing Provider  allopurinol (ZYLOPRIM) 100 MG tablet Take 100 mg by mouth daily.   Yes [provider]  aspirin EC 81 MG tablet Take 81 mg by mouth daily.   Yes [provider]  atorvastatin (LIPITOR) 40 MG tablet Take 40 mg by mouth daily.   Yes [provider]  ENTRESTO 24-26 MG Take 1 tablet by mouth daily. 05/24/21  Yes [provider]  furosemide (LASIX) 20 MG tablet Take 20 mg by mouth daily.   Yes [provider]  JARDIANCE 10 MG TABS tablet Take 10 mg by mouth daily. 05/24/21  Yes [provider]  lipase/protease/amylase (CREON) 12000-38000 units CPEP capsule Take 12,000 Units by mouth 3 (three) times daily with meals.   Yes [provider]  metoprolol succinate (TOPROL-XL) 25 MG 24 hr tablet Take 25 mg by mouth daily. 05/24/21  Yes [provider]  pantoprazole (PROTONIX) 40 MG tablet Take 40 mg by mouth daily.   Yes [provider]  solifenacin (VESICARE) 5 MG tablet Take 5 mg by mouth daily.   Yes [provider]  spironolactone (ALDACTONE) 25 MG tablet Take 12.5 mg by mouth daily. 05/24/21  Yes [provider]  benzonatate (TESSALON) 100 MG capsule Take 1 capsule (100 mg total) by mouth every 8 (eight) hours. Patient not taking: Reported on 06/07/2021 05/22/15   Tanna Furry, MD  nitroGLYCERIN (NITROSTAT) 0.4 MG SL tablet Place 1 tablet (0.4 mg total) under the tongue every 5 (five) minutes as needed for chest pain (3 tablets maximum). 01/15/12   Delfina Redwood, MD  ondansetron (ZOFRAN ODT) 4 MG disintegrating tablet Take 1 tablet (4 mg total) by mouth every 8 (eight) hours as needed for nausea. Patient not taking: Reported on 06/07/2021 05/22/15   Tanna Furry, MD  oxyCODONE-acetaminophen (PERCOCET/ROXICET) 5-325 MG tablet Take 1 tablet by mouth every 6 (six) hours as needed for severe pain. Patient not taking: Reported on 06/07/2021 01/09/16   Horton,  Barbette Hair, MD  predniSONE (DELTASONE) 10 MG tablet Take 2 tablets (20 mg total) by mouth daily. Patient not taking: Reported on 06/07/2021 01/09/16   Merryl Hacker, MD    Review of Systems:  Constitutional:  No weight loss, night sweats, Fevers, chills, fatigue.  Head&Eyes: No headache.  No vision loss.  No eye pain or scotoma ENT:  No Difficulty swallowing,Tooth/dental problems,Sore throat,  No ear ache, post nasal drip,  Cardio-vascular:  No Orthopnea, PND, swelling in lower extremities,  dizziness, palpitations  GI:  No  abdominal pain, nausea, vomiting, diarrhea, loss of appetite, hematochezia, melena, heartburn, indigestion, Resp:  No shortness of breath with exertion or at rest. No cough. No coughing up of blood .No wheezing.No chest wall deformity  Skin:  no rash or lesions.  GU:  no dysuria, change in color of urine, no urgency or frequency. No flank pain.  Musculoskeletal:  No joint pain or swelling. No decreased range of motion. No back pain.  Psych:  No change in mood or affect. No depression or anxiety. Neurologic: No headache, no dysesthesia, no focal weakness, no vision loss. No syncope  Physical Exam: Vitals:   06/07/21 1200 06/07/21 1230 06/07/21 1300 06/07/21 1315  BP: Marland Kitchen)  148/88 (!) 159/88 134/84   Pulse: 64 63 61 65  Resp: (!) 22 (!) 21 18 (!) 21  Temp:      TempSrc:      SpO2: 100% 99% 99% 100%  Weight:      Height:       General:  A&O x 3, NAD, nontoxic, pleasant/cooperative Head/Eye: No conjunctival hemorrhage, no icterus, [place]/AT, No nystagmus ENT:  No icterus,  No thrush, good dentition, no pharyngeal exudate Neck:  No masses, no lymphadenpathy, no bruits CV:  RRR, no rub, no gallop, no S3 Lung:  CTAB, good air movement, no wheeze, no rhonchi Abdomen: soft/NT, +BS, nondistended, no peritoneal signs Ext: No cyanosis, No rashes, No petechiae, No lymphangitis, No edema Neuro: CNII-XII intact, strength 4/5 in bilateral upper and lower  extremities, no dysmetria  Labs on Admission:  Basic Metabolic Panel: Recent Labs  Lab 06/07/21 1127  NA 137  K 4.0  CL 106  CO2 23  GLUCOSE 108*  BUN 20  CREATININE 1.57*  CALCIUM 8.8*   Liver Function Tests: No results for input(s): AST, ALT, ALKPHOS, BILITOT, PROT, ALBUMIN in the last 168 hours. Recent Labs  Lab 06/07/21 1127  LIPASE 38   No results for input(s): AMMONIA in the last 168 hours. CBC: Recent Labs  Lab 06/07/21 1127  WBC 4.2  NEUTROABS 3.2  HGB 13.8  HCT 43.2  MCV 94.7  PLT 111*   Coagulation Profile: No results for input(s): INR, PROTIME in the last 168 hours. Cardiac Enzymes: No results for input(s): CKTOTAL, CKMB, CKMBINDEX, TROPONINI in the last 168 hours. BNP: Invalid input(s): POCBNP CBG: Recent Labs  Lab 06/07/21 1304  GLUCAP 82   Urine analysis:    Component Value Date/Time   COLORURINE YELLOW 01/15/2012 0815   APPEARANCEUR CLEAR 01/15/2012 0815   LABSPEC 1.005 01/15/2012 0815   PHURINE 5.5 01/15/2012 0815   GLUCOSEU NEGATIVE 01/15/2012 0815   HGBUR NEGATIVE 01/15/2012 0815   BILIRUBINUR NEGATIVE 01/15/2012 0815   KETONESUR NEGATIVE 01/15/2012 0815   PROTEINUR NEGATIVE 01/15/2012 0815   UROBILINOGEN 0.2 01/15/2012 0815   NITRITE NEGATIVE 01/15/2012 0815   LEUKOCYTESUR NEGATIVE 01/15/2012 0815   Sepsis Labs: @LABRCNTIP (procalcitonin:4,lacticidven:4) )No results found for this or any previous visit (from the past 240 hour(s)).   Radiological Exams on Admission: DG Chest Portable 1 View  Result Date: 06/07/2021 CLINICAL DATA:  Chest pain EXAM: PORTABLE CHEST 1 VIEW COMPARISON:  05/22/2015 FINDINGS: Sternotomy wires overlie normal cardiac silhouette. Ectatic aorta. Normal pulmonary vasculature. No effusion, infiltrate, or pneumothorax. No acute osseous abnormality. IMPRESSION: 1.  No acute cardiopulmonary process. 2. No change from prior. Electronically Signed   By: Suzy Bouchard M.D.   On: 06/07/2021 11:17    EKG:  Independently reviewed. sinus    Time spent:60 minutes Code Status:   FULL Family Communication:  No Family at bedside Disposition Plan: expect 1-2 day hospitalization Consults called: cardiology  DVT Prophylaxis: [place] Lovenox  Orson Eva, DO  [facility] Pager [phone]  If 7PM-7AM, please contact night-coverage [email or web address] Password [facility] 06/07/2021, 1:44 PM

## 2021-06-08 ENCOUNTER — Observation Stay (HOSPITAL_COMMUNITY): Payer: Medicare Other

## 2021-06-08 DIAGNOSIS — Z90411 Acquired partial absence of pancreas: Secondary | ICD-10-CM | POA: Diagnosis not present

## 2021-06-08 DIAGNOSIS — Z923 Personal history of irradiation: Secondary | ICD-10-CM | POA: Diagnosis not present

## 2021-06-08 DIAGNOSIS — Z85118 Personal history of other malignant neoplasm of bronchus and lung: Secondary | ICD-10-CM | POA: Diagnosis not present

## 2021-06-08 DIAGNOSIS — M109 Gout, unspecified: Secondary | ICD-10-CM | POA: Diagnosis present

## 2021-06-08 DIAGNOSIS — I5022 Chronic systolic (congestive) heart failure: Secondary | ICD-10-CM | POA: Diagnosis present

## 2021-06-08 DIAGNOSIS — I2510 Atherosclerotic heart disease of native coronary artery without angina pectoris: Secondary | ICD-10-CM | POA: Diagnosis present

## 2021-06-08 DIAGNOSIS — Z833 Family history of diabetes mellitus: Secondary | ICD-10-CM | POA: Diagnosis not present

## 2021-06-08 DIAGNOSIS — Z8546 Personal history of malignant neoplasm of prostate: Secondary | ICD-10-CM | POA: Diagnosis not present

## 2021-06-08 DIAGNOSIS — I130 Hypertensive heart and chronic kidney disease with heart failure and stage 1 through stage 4 chronic kidney disease, or unspecified chronic kidney disease: Secondary | ICD-10-CM | POA: Diagnosis present

## 2021-06-08 DIAGNOSIS — Z87891 Personal history of nicotine dependence: Secondary | ICD-10-CM | POA: Diagnosis not present

## 2021-06-08 DIAGNOSIS — I25119 Atherosclerotic heart disease of native coronary artery with unspecified angina pectoris: Secondary | ICD-10-CM | POA: Diagnosis not present

## 2021-06-08 DIAGNOSIS — Z681 Body mass index (BMI) 19 or less, adult: Secondary | ICD-10-CM | POA: Diagnosis not present

## 2021-06-08 DIAGNOSIS — E782 Mixed hyperlipidemia: Secondary | ICD-10-CM | POA: Diagnosis present

## 2021-06-08 DIAGNOSIS — R0789 Other chest pain: Secondary | ICD-10-CM | POA: Diagnosis present

## 2021-06-08 DIAGNOSIS — N1832 Chronic kidney disease, stage 3b: Secondary | ICD-10-CM | POA: Diagnosis present

## 2021-06-08 DIAGNOSIS — C3490 Malignant neoplasm of unspecified part of unspecified bronchus or lung: Secondary | ICD-10-CM | POA: Diagnosis not present

## 2021-06-08 DIAGNOSIS — Z940 Kidney transplant status: Secondary | ICD-10-CM | POA: Diagnosis not present

## 2021-06-08 DIAGNOSIS — Z8509 Personal history of malignant neoplasm of other digestive organs: Secondary | ICD-10-CM | POA: Diagnosis not present

## 2021-06-08 DIAGNOSIS — C781 Secondary malignant neoplasm of mediastinum: Secondary | ICD-10-CM | POA: Diagnosis present

## 2021-06-08 DIAGNOSIS — I255 Ischemic cardiomyopathy: Secondary | ICD-10-CM | POA: Diagnosis present

## 2021-06-08 DIAGNOSIS — R079 Chest pain, unspecified: Secondary | ICD-10-CM | POA: Diagnosis present

## 2021-06-08 DIAGNOSIS — E440 Moderate protein-calorie malnutrition: Secondary | ICD-10-CM | POA: Diagnosis present

## 2021-06-08 DIAGNOSIS — Z905 Acquired absence of kidney: Secondary | ICD-10-CM | POA: Diagnosis not present

## 2021-06-08 DIAGNOSIS — Z20822 Contact with and (suspected) exposure to covid-19: Secondary | ICD-10-CM | POA: Diagnosis present

## 2021-06-08 DIAGNOSIS — Z9049 Acquired absence of other specified parts of digestive tract: Secondary | ICD-10-CM | POA: Diagnosis not present

## 2021-06-08 DIAGNOSIS — Z951 Presence of aortocoronary bypass graft: Secondary | ICD-10-CM | POA: Diagnosis not present

## 2021-06-08 LAB — BASIC METABOLIC PANEL
Anion gap: 6 (ref 5–15)
BUN: 23 mg/dL (ref 8–23)
CO2: 23 mmol/L (ref 22–32)
Calcium: 8.7 mg/dL — ABNORMAL LOW (ref 8.9–10.3)
Chloride: 108 mmol/L (ref 98–111)
Creatinine, Ser: 1.62 mg/dL — ABNORMAL HIGH (ref 0.61–1.24)
GFR, Estimated: 41 mL/min — ABNORMAL LOW (ref >60)
Glucose, Bld: 91 mg/dL (ref 70–99)
Potassium: 4.2 mmol/L (ref 3.5–5.1)
Sodium: 137 mmol/L (ref 135–145)

## 2021-06-08 LAB — CBC
HCT: 38.5 % — ABNORMAL LOW (ref 39.0–52.0)
Hemoglobin: 12.1 g/dL — ABNORMAL LOW (ref 13.0–17.0)
MCH: 28.9 pg (ref 26.0–34.0)
MCHC: 31.4 g/dL (ref 30.0–36.0)
MCV: 91.9 fL (ref 80.0–100.0)
Platelets: 109 10*3/uL — ABNORMAL LOW (ref 150–400)
RBC: 4.19 MIL/uL — ABNORMAL LOW (ref 4.22–5.81)
RDW: 14.2 % (ref 11.5–15.5)
WBC: 4.8 10*3/uL (ref 4.0–10.5)
nRBC: 0 % (ref 0.0–0.2)

## 2021-06-08 LAB — LIPID PANEL
Cholesterol: 101 mg/dL (ref 0–200)
HDL: 36 mg/dL — ABNORMAL LOW (ref >40)
LDL Cholesterol: 56 mg/dL (ref 0–99)
Total CHOL/HDL Ratio: 2.8 RATIO
Triglycerides: 47 mg/dL (ref <150)
VLDL: 9 mg/dL (ref 0–40)

## 2021-06-08 LAB — ECHOCARDIOGRAM COMPLETE
Area-P 1/2: 2.66 cm2
Height: 66 in
S' Lateral: 4.1 cm
Single Plane A4C EF: 33 %
Weight: 1907.2 oz

## 2021-06-08 LAB — HEMOGLOBIN A1C
Hgb A1c MFr Bld: 6.4 % — ABNORMAL HIGH (ref 4.8–5.6)
Mean Plasma Glucose: 136.98 mg/dL

## 2021-06-08 MED ORDER — FENTANYL CITRATE PF 50 MCG/ML IJ SOSY
25.0000 ug | PREFILLED_SYRINGE | Freq: Once | INTRAMUSCULAR | Status: AC
  Administered 2021-06-08: 25 ug via INTRAVENOUS
  Filled 2021-06-08: qty 1

## 2021-06-08 MED ORDER — ISOSORBIDE MONONITRATE ER 30 MG PO TB24
15.0000 mg | ORAL_TABLET | Freq: Every day | ORAL | Status: DC
  Administered 2021-06-08 – 2021-06-09 (×2): 15 mg via ORAL
  Filled 2021-06-08 (×2): qty 1

## 2021-06-08 NOTE — Progress Notes (Signed)
Pt is c/o increased chest pain, unable to lay on left side due to increased pain. VSS. Skin warm and dry. MD Tat notified of current c/o and pt request for pain med. Echo tech in room attempting to complete ordered echocardiogram. Ordered oral Imdur given. Order received for pain med from MD Tat.

## 2021-06-08 NOTE — Progress Notes (Signed)
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  Patient states that he has been having substernal and left-sided chest pain for the last 2 to 3 days.  He cannot relate any inciting factors.  He states that the pain primarily occurs at rest and is not any worse with exertion.  He denies any fevers, chills, coughing, hemoptysis, nausea, vomiting, diarrhea, dizziness, syncope.  He states that the chest pain lasts anywhere from 20 minutes to 1 hour.  It is sharp in nature without any radiation.  The patient went to urgent care today with chest pain and was directed to the emergency department for further evaluation. ED Patient was afebrile and hemodynamically stable with oxygen saturation 100% room air.  BMP showed sodium 137, potassium 4.0, bicarbonate 23, serum creatinine 1.57.  WBC 4.2, hemoglobin 13.8, platelets 111,000.  EKG shows sinus rhythm with nonspecific ST-T wave changes.  Chest x-ray was negative for any infiltrates.  Initial troponin was 11>>12  Assessment/Plan: Chest pain -Appears atypical by clinical history -Finish cycling troponins 11>>12 -Check D-dimer--1.06 -Cardiology consult appreciated>>continue medical therapy -TTE 03/30/18 LVEF 45-50% -History of CAD with three-vessel CABG 1996 -1/12 CTA chest--no PE but concerns for aortic dissection -1/12 CTA aorta--no dissection -06/08/21 echo EF 35-40%, G1DD, +WMA, PASP 59.3   CKD stage IIIb -Baseline creatinine 1.2-1.5 -Patient has history of left nephrectomy 1972, patient cannot  recall why -repeat BMP in am -IVF x 24 hours after contrast load   Hyperlipidemia -Continue statin -LDL 56   Essential hypertension -continue metoprolol succinate -Repeat BMP in the morning   Squamous cell carcinoma of the lung -Status post wedge resection January 09, 2018 -Patient likely has had a recurrence--finished palliative RT for mediastinal mass July 2022   Ampullary adenocarcinoma -Status post resection 03/26/2018 -decided to forego adjuvant chemotherapy for his ampullary malignancy after an informed discussion and started on an active surveillance program  -Patient follows at [facility] -Continue allopurinol   Chronic systolic CHF --TTE 92/3/30 LVEF 45-50% -clinically euvolemic -continue metoprolol succinate -continue Jardiance -hold Entresto and spiro temporarily due to contrast load               Family Communication:   daughter updated  Consultants:  cardiology  Code Status:  FULL  DVT Prophylaxis:  Texline Lovenox   Procedures: As Listed in Progress Note Above  Antibiotics: None     Subjective:  Patient denies fevers, chills, headache, chest pain, dyspnea, nausea, vomiting, diarrhea, abdominal pain, dysuria, hematuria, hematochezia, and melena.  Objective: Vitals:   06/08/21 0201 06/08/21 0541 06/08/21 1100 06/08/21 1451  BP: 132/86 129/81 130/72 106/64  Pulse: 74 69 61 60  Resp: 18 20 20 20   Temp: 98.7 F (37.1 C) 98 F (36.7 C)  97.8 F (36.6 C)  TempSrc: Oral   Oral  SpO2: 98% 99% 100% 100%  Weight:      Height:        Intake/Output Summary (Last 24 hours) at 06/08/2021 1704 Last data filed at 06/08/2021 1500 Gross per 24 hour  Intake 1549.36 ml  Output --  Net 1549.36 ml   Weight change:  Exam:  General:  Pt is alert, follows commands appropriately, not in acute distress HEENT: No icterus, No thrush, No neck mass, [place]/AT Cardiovascular: RRR, S1/S2, no rubs, no gallops Respiratory: CTA bilaterally, no  wheezing, no crackles, no rhonchi Abdomen: Soft/+BS, non tender, non distended, no guarding Extremities: No edema, No lymphangitis, No petechiae, No rashes, no synovitis   Data Reviewed: I have personally reviewed following labs and imaging studies Basic Metabolic Panel: Recent Labs  Lab 06/07/21 1127 06/08/21 0505  NA 137 137  K 4.0 4.2  CL 106 108  CO2 23 23  GLUCOSE 108* 91  BUN 20 23  CREATININE 1.57* 1.62*  CALCIUM 8.8* 8.7*   Liver Function Tests: No results for input(s): AST, ALT, ALKPHOS, BILITOT, PROT, ALBUMIN in the last 168 hours. Recent Labs  Lab 06/07/21 1127  LIPASE 38   No results for input(s): AMMONIA in the last 168 hours. Coagulation Profile: No results for input(s): INR, PROTIME in the last 168 hours. CBC: Recent Labs  Lab 06/07/21 1127 06/08/21 0505  WBC 4.2 4.8  NEUTROABS 3.2  --   HGB 13.8 12.1*  HCT 43.2 38.5*  MCV 94.7 91.9  PLT 111* 109*   Cardiac Enzymes: No results for input(s): CKTOTAL, CKMB, CKMBINDEX, TROPONINI in the last 168 hours. BNP: Invalid input(s): POCBNP CBG: Recent Labs  Lab 06/07/21 1304  GLUCAP 82   HbA1C: Recent Labs    06/08/21 0505  HGBA1C 6.4*   Urine analysis:    Component Value Date/Time   COLORURINE YELLOW 01/15/2012 0815   APPEARANCEUR CLEAR 01/15/2012 0815   LABSPEC 1.005 01/15/2012 0815   PHURINE 5.5 01/15/2012 0815   GLUCOSEU NEGATIVE 01/15/2012 0815   HGBUR NEGATIVE 01/15/2012 0815   BILIRUBINUR NEGATIVE 01/15/2012 0815   KETONESUR NEGATIVE 01/15/2012 0815   PROTEINUR NEGATIVE 01/15/2012 0815   UROBILINOGEN 0.2 01/15/2012 0815   NITRITE NEGATIVE 01/15/2012 0815   LEUKOCYTESUR NEGATIVE 01/15/2012 0815   Sepsis Labs: @LABRCNTIP (procalcitonin:4,lacticidven:4) ) Recent Results (from the past 240 hour(s))  Resp Panel by RT-PCR (Flu A&B, Covid) Nasopharyngeal Swab     Status: None   Collection Time: 06/07/21  1:52 PM   Specimen: Nasopharyngeal Swab; Nasopharyngeal(NP) swabs in vial  transport medium  Result Value Ref Range Status   SARS Coronavirus 2 by RT PCR NEGATIVE NEGATIVE Final    Comment: (NOTE) SARS-CoV-2 target nucleic acids are NOT DETECTED.  The SARS-CoV-2 RNA is generally detectable in upper respiratory specimens during the acute phase of infection. The lowest concentration of SARS-CoV-2 viral copies this assay can detect is 138 copies/mL. A negative result does not preclude SARS-Cov-2 infection and should not be used as the sole basis for treatment or other patient management decisions. A negative result may occur with  improper specimen collection/handling, submission of specimen other than nasopharyngeal swab, presence of viral mutation(s) within the areas targeted by this assay, and inadequate number of viral copies(<138 copies/mL). A negative result must be combined with clinical observations, patient history, and epidemiological information. The expected result is Negative.  Fact Sheet for Patients:  EntrepreneurPulse.com.au  Fact Sheet for Healthcare Providers:  IncredibleEmployment.be  This test is no t yet approved or cleared by the [place] FDA and  has been authorized for detection and/or diagnosis of SARS-CoV-2 by FDA under an Emergency Use Authorization (EUA). This EUA will remain  in effect (meaning this test can be used) for the duration of the COVID-19 declaration under Section 564(b)(1) of the Act,  21 U.S.C.section 360bbb-3(b)(1), unless the authorization is terminated  or revoked sooner.       Influenza A by PCR NEGATIVE NEGATIVE Final   Influenza B by PCR NEGATIVE NEGATIVE Final    Comment: (NOTE) The Xpert Xpress SARS-CoV-2/FLU/RSV plus assay is intended as an aid in the diagnosis of influenza from Nasopharyngeal swab specimens and should not be used as a sole basis for treatment. Nasal washings and aspirates are unacceptable for Xpert Xpress SARS-CoV-2/FLU/RSV testing.  Fact  Sheet for Patients: EntrepreneurPulse.com.au  Fact Sheet for Healthcare Providers: IncredibleEmployment.be  This test is not yet approved or cleared by the [place] FDA and has been authorized for detection and/or diagnosis of SARS-CoV-2 by FDA under an Emergency Use Authorization (EUA). This EUA will remain in effect (meaning this test can be used) for the duration of the COVID-19 declaration under Section 564(b)(1) of the Act, 21 U.S.C. section 360bbb-3(b)(1), unless the authorization is terminated or revoked.  Performed at [facility], [address], [place], [place] [address]      Scheduled Meds:  allopurinol  100 mg Oral Daily   aspirin EC  81 mg Oral Daily   atorvastatin  40 mg Oral q1800   darifenacin  7.5 mg Oral Daily   empagliflozin  10 mg Oral Daily   enoxaparin (LOVENOX) injection  30 mg Subcutaneous Q24H   feeding supplement  237 mL Oral BID BM   hydrALAZINE  25 mg Oral Q8H   isosorbide mononitrate  15 mg Oral Daily   metoprolol succinate  25 mg Oral Daily   pantoprazole  40 mg Oral Daily   Continuous Infusions:  sodium chloride 75 mL/hr at 06/08/21 0557    Procedures/Studies: CT Angio Chest Pulmonary Embolism (PE) W or WO Contrast  Result Date: 06/07/2021 CLINICAL DATA:  Left sided chest pain x 2 days. Pulmonary embolism (PE) suspected, high prob chest pain. elevated D-dimer EXAM: CT ANGIOGRAPHY CHEST WITH CONTRAST TECHNIQUE: Multidetector CT imaging of the chest was performed using the standard protocol during bolus administration of intravenous contrast. Multiplanar CT image reconstructions and MIPs were obtained to evaluate the vascular anatomy. RADIATION DOSE REDUCTION: This exam was performed according to the departmental dose-optimization program which includes automated exposure control, adjustment of the mA and/or kV according to patient size and/or use of iterative reconstruction technique. CONTRAST:  33mL OMNIPAQUE  IOHEXOL 350 MG/ML SOLN COMPARISON:  Chest XR, 06/07/2021 and 05/22/2015. FINDINGS: Suboptimal evaluation, secondary to motion degradation. Cardiovascular: Satisfactory opacification of the pulmonary arteries to the segmental level. No segmental or larger pulmonary embolus. 3.6 cm ascending thoracic aorta. Poorly assessed filling defect appearance of the proximal descending thoracic aorta, with adjacent posterior mediastinal soft tissue thickening. Cardiomegaly with multi chamber enlargement. No pericardial effusion. A severe burden of multivessel coronary atherosclerosis is present. Main PA enlargement, measuring 3.3 cm likely reflecting underlying pulmonary arterial hypertension. Mediastinum/Nodes: No enlarged mediastinal, hilar, or axillary lymph nodes. Enlarged, nodular appearance of the LEFT thyroid gland. Thyroid gland, trachea, and esophagus demonstrate no significant findings. Lungs/Pleura: Centrilobular emphysematous lung changes, greatest within the lung bases. LEFT apical pleural thickening. LEFT fissural thickening with calcification, no discrete mass lesion. 4 mm RIGHT upper lobe pulmonary nodule with internal calcification. No pleural effusion or pneumothorax. Upper Abdomen: Severely degraded for evaluation. Cholecystectomy clips. Colonic interposition at the LEFT upper quadrant. Musculoskeletal: Median sternotomy. No acute chest wall abnormality. Lower cervical spine degenerative change, incompletely imaged. No acute osseous findings. Review of the MIP images confirms the above findings. IMPRESSION: 1. No  segmental or larger pulmonary embolus. 2. Main pulmonary arterial enlargement, reflecting underlying pulmonary arterial hypertension. 3. Filling defect appearance of the proximal descending thoracic aorta with posterior mediastinal soft tissue thickening. This is poorly assessed on this evaluation, and differential diagnosis includes dissection with intimal flap and intramural hematoma, however  contrast mixing can appear similar. Recommend dedicated repeat CTA chest dissection protocol for further evaluation. 4. Centrilobular emphysematous lung changes with LEFT fissural thickening and calcifications. No discrete mass lesion. Attention on follow-up. 5. Incidental heterogeneous and enlarged LEFT thyroid. Recommend non-emergent, outpatient thyroid US if clinically warranted given patient age. Reference: J Am Coll Radiol. 2015 Feb;12(2): 143-50 These results were called by telephone at the time of interpretation on 06/07/2021 at 4:56 pm to provider Cleburne Savini , who verbally acknowledged these results. Electronically Signed   By: Michaelle Birks M.D.   On: 06/07/2021 17:16   DG Chest Portable 1 View  Result Date: 06/07/2021 CLINICAL DATA:  Chest pain EXAM: PORTABLE CHEST 1 VIEW COMPARISON:  05/22/2015 FINDINGS: Sternotomy wires overlie normal cardiac silhouette. Ectatic aorta. Normal pulmonary vasculature. No effusion, infiltrate, or pneumothorax. No acute osseous abnormality. IMPRESSION: 1.  No acute cardiopulmonary process. 2. No change from prior. Electronically Signed   By: Suzy Bouchard M.D.   On: 06/07/2021 11:17   ECHOCARDIOGRAM COMPLETE  Result Date: 06/08/2021    ECHOCARDIOGRAM REPORT   Patient Name:   CALLUM WOLF Vinas Date of Exam: 06/08/2021 Medical Rec #:  [id number]        Height:       66.0 in Accession #:    [id number]       Weight:       119.2 lb Date of Birth:  03-13-1933        BSA:          1.605 m Patient Age:    16 years         BP:           129/81 mmHg Patient Gender: M                HR:           61 bpm. Exam Location:  [facility] Procedure: 2D Echo Indications:    chest pain  History:        Patient has no prior history of Echocardiogram examinations.                 Prior CABG, chronic kidney disease; Risk Factors:Former Smoker,                 Hypertension and Dyslipidemia.  Sonographer:    Johny Chess RDCS Referring Phys: [phone] Sheron Tallman IMPRESSIONS  1. Left ventricular  ejection fraction, by estimation, is 35 to 40%. The left ventricle has moderately decreased function. The left ventricle demonstrates regional wall motion abnormalities (see scoring diagram/findings for description). There is mild left ventricular hypertrophy. Left ventricular diastolic parameters are consistent with Grade I diastolic dysfunction (impaired relaxation).  2. Right ventricular systolic function is moderately reduced. The right ventricular size is mildly enlarged. There is moderately elevated pulmonary artery systolic pressure. The estimated right ventricular systolic pressure is 02.5 mmHg.  3. Left atrial size was mildly dilated.  4. Right atrial size was upper normal.  5. The mitral valve is abnormal. Mild mitral valve regurgitation.  6. Tricuspid valve regurgitation is moderate.  7. The aortic valve is tricuspid. Aortic valve regurgitation is trivial. Aortic valve sclerosis/calcification is present, without any evidence of  aortic stenosis.  8. The inferior vena cava is normal in size with <50% respiratory variability, suggesting right atrial pressure of 8 mmHg. Comparison(s): Prior images unable to be directly viewed, comparison made by report only. FINDINGS  Left Ventricle: Left ventricular ejection fraction, by estimation, is 35 to 40%. The left ventricle has moderately decreased function. The left ventricle demonstrates regional wall motion abnormalities. The left ventricular internal cavity size was normal in size. There is mild left ventricular hypertrophy. Left ventricular diastolic parameters are consistent with Grade I diastolic dysfunction (impaired relaxation).  LV Wall Scoring: The basal inferolateral segment, basal anterolateral segment, and basal inferior segment are akinetic. The entire anterior wall, mid and distal lateral wall, entire septum, entire apex, mid and distal inferior wall, and mid anterolateral segment are hypokinetic. Right Ventricle: The right ventricular size is mildly  enlarged. No increase in right ventricular wall thickness. Right ventricular systolic function is moderately reduced. There is moderately elevated pulmonary artery systolic pressure. The tricuspid regurgitant velocity is 3.58 m/s, and with an assumed right atrial pressure of 8 mmHg, the estimated right ventricular systolic pressure is 17.0 mmHg. Left Atrium: Left atrial size was mildly dilated. Right Atrium: Right atrial size was upper normal. Pericardium: There is no evidence of pericardial effusion. Mitral Valve: The mitral valve is abnormal. There is moderate thickening of the mitral valve leaflet(s). Mild mitral valve regurgitation. Tricuspid Valve: The tricuspid valve is grossly normal. Tricuspid valve regurgitation is moderate. Aortic Valve: The aortic valve is tricuspid. There is mild aortic valve annular calcification. Aortic valve regurgitation is trivial. Aortic valve sclerosis/calcification is present, without any evidence of aortic stenosis. Pulmonic Valve: The pulmonic valve was grossly normal. Pulmonic valve regurgitation is mild. Aorta: The aortic root is normal in size and structure. Venous: The inferior vena cava is normal in size with less than 50% respiratory variability, suggesting right atrial pressure of 8 mmHg. IAS/Shunts: No atrial level shunt detected by color flow Doppler.  LEFT VENTRICLE PLAX 2D LVIDd:         5.00 cm      Diastology LVIDs:         4.10 cm      LV e' medial:    3.05 cm/s LV PW:         1.10 cm      LV E/e' medial:  14.6 LV IVS:        1.10 cm      LV e' lateral:   4.46 cm/s LVOT diam:     2.40 cm      LV E/e' lateral: 10.0 LV SV:         60 LV SV Index:   37 LVOT Area:     4.52 cm  LV Volumes (MOD) LV vol d, MOD A4C: 128.0 ml LV vol s, MOD A4C: 85.8 ml LV SV MOD A4C:     128.0 ml RIGHT VENTRICLE            IVC RV Basal diam:  3.50 cm    IVC diam: 2.00 cm RV S prime:     8.92 cm/s TAPSE (M-mode): 1.2 cm LEFT ATRIUM             Index        RIGHT ATRIUM           Index LA  diam:        3.00 cm 1.87 cm/m   RA Area:     19.00 cm LA Vol (A2C):  57.6 ml 35.89 ml/m  RA Volume:   53.50 ml  33.33 ml/m LA Vol (A4C):   47.4 ml 29.53 ml/m LA Biplane Vol: 56.1 ml 34.95 ml/m  AORTIC VALVE LVOT Vmax:   69.00 cm/s LVOT Vmean:  43.300 cm/s LVOT VTI:    0.133 m  AORTA Ao Root diam: 3.80 cm Ao Asc diam:  3.20 cm MITRAL VALVE               TRICUSPID VALVE MV Area (PHT): 2.66 cm    TR Peak grad:   51.3 mmHg MV Decel Time: 285 msec    TR Vmax:        358.00 cm/s MV E velocity: 44.60 cm/s MV A velocity: 65.60 cm/s  SHUNTS MV E/A ratio:  0.68        Systemic VTI:  0.13 m                            Systemic Diam: 2.40 cm Rozann Lesches MD Electronically signed by Rozann Lesches MD Signature Date/Time: 06/08/2021/11:50:42 AM    Final    CT Angio Chest/Abd/Pel for Dissection W and/or W/WO  Result Date: 06/07/2021 CLINICAL DATA:  Chest pain or back pain. Aortic dissection suspected. Abnormal CTA chest with concern for aortic dissection. EXAM: CT ANGIOGRAPHY CHEST, ABDOMEN AND PELVIS TECHNIQUE: Non-contrast CT of the chest was initially obtained. Multidetector CT imaging through the chest, abdomen and pelvis was performed using the standard protocol during bolus administration of intravenous contrast. Multiplanar reconstructed images and MIPs were obtained and reviewed to evaluate the vascular anatomy. RADIATION DOSE REDUCTION: This exam was performed according to the departmental dose-optimization program which includes automated exposure control, adjustment of the mA and/or kV according to patient size and/or use of iterative reconstruction technique. CONTRAST:  Uniformly fills the lumen in this region without evidence of dissection or aneurysm. No abdominal aortic aneurysm. COMPARISON:  CTA chest 06/07/2021 earlier same day FINDINGS: CTA CHEST FINDINGS Cardiovascular: Heart size is mildly to moderately enlarged. No pericardial effusion. Dense coronary artery calcifications are seen. Moderate  atherosclerotic calcifications within the thoracic aorta. Mediastinum/Nodes: No enlarged mediastinal, hilar, or axillary lymph nodes. There is again enlarged and heterogeneous left thyroid gland with internal nodularity, not well evaluated on the current modality. Lungs/Pleura: The central airways are patent. Mild-to-moderate centrilobular emphysematous changes, again greatest within lung bases. Mild biapical pleural scarring. Mild left major fissure linear thickening is unchanged from prior. No pleural effusion or pneumothorax. Musculoskeletal: Status post median sternotomy. Mild thoracic spine multilevel degenerative disc changes. Review of the MIP images confirms the above findings. CTA ABDOMEN AND PELVIS FINDINGS VASCULAR Aorta: No hyperdensity is seen on precontrast images to indicate an intramural hematoma within the thoracic or abdominal aorta. The ascending aorta measures up to 3.7 cm in caliber, ectatic but not aneurysmal. The descending thoracic aorta measures up to 3.1 cm in caliber to the level of the main pulmonary artery and 2.3 cm in caliber at the level of the aortic hiatus. The region of questioned filling defect within the proximal descending thoracic aorta on CTA chest earlier same day appears to have been secondary to mixing of contrasted and non contrasted blood. On the current study this region uniformly enhances without evidence of aortic dissection or aneurysm. Celiac: Patent without evidence of aneurysm, dissection, vasculitis or significant stenosis. SMA: Patent without evidence of aneurysm, dissection, vasculitis or significant stenosis. Renals: The left kidney and renal artery are surgically absent. The  right renal artery is patent and within normal limits. IMA: An attenuated opacified inferior mesenteric artery is seen on axial series 6, images 121 through 151. Inflow: The common, internal, and external iliac arteries are patent. Veins: No obvious venous abnormality within the  limitations of this arterial phase study. Review of the MIP images confirms the above findings. NON-VASCULAR Hepatobiliary: Minimally nodular contour of the liver. No focal arterial phase liver lesion is seen. The gallbladder appears to be surgically absent. There is again mild air within a left hepatic lobe bile duct and within the common bile duct presumably secondary to prior surgery. Pancreas: Grossly unremarkable. Spleen: Unremarkable Adrenals/Urinary Tract: Normal adrenal glands. The left kidney appears to be surgically absent. There is an 9 mm low-attenuation lesion within the inferior pole of the left kidney, too small to further characterize but statistically most likely a cyst. Stomach/Bowel: The transverse colon extends into a region of moderate midline ventral rectus diastasis. The appendix is grossly unremarkable. No dilated loops of bowel to indicate bowel obstruction. Lymphatic: No abdominopelvic lymphadenopathy. Reproductive: The prostate is mildly enlarged. Other: Moderate left and very mild right inguinal hernias containing minimal small-bowel in the right and moderate sigmoid colon on the left. Musculoskeletal: Grade 1 anterolisthesis of L4 on L5 with severe L4-5 and moderate L5-S1 degenerative disc changes. Review of the MIP images confirms the above findings. IMPRESSION:: IMPRESSION: 1. No thoracic or abdominal aortic aneurysm or dissection. The appearance on CT of the chest earlier today likely reflected mixing of contrasted and non contrasted blood within the proximal descending thoracic aorta. 2. Mild-to-moderate cardiomegaly. Ectatic ascending aorta. Aortic Atherosclerosis (ICD10-I70.0). 3. Minimally nodular contour of the liver. This can be seen with cirrhosis. 4. Status post cholecystectomy. This surgery and possible ampullary intervention is likely the etiology for mild pneumobilia. 5. Moderate midline rectus diastasis. Moderate left and very mild right bowel containing inguinal hernias.  No bowel obstruction. Electronically Signed   By: Yvonne Kendall   On: 06/07/2021 18:10    Orson Eva, DO  [facility]  If 7PM-7AM, please contact night-coverage [email or web address] Password [email or web address] 06/08/2021, 5:04 PM   LOS: 0 days

## 2021-06-08 NOTE — Progress Notes (Signed)
Administered one time dose of Fentanyl via IV for c/o increasing c/p during echocardiogram. Pt currently resting in bed at this time, son at bedside.

## 2021-06-08 NOTE — Progress Notes (Signed)
°  Transition of Care (TOC) Screening Note   Patient Details  Name: LIBERO PUTHOFF Date of Birth: 04-17-1933   Transition of Care [facility]) CM/SW Contact:    Ihor Gully, LCSW Phone Number: 06/08/2021, 2:01 PM    Transition of Care Department [facility]) has reviewed patient and no TOC needs have been identified at this time. We will continue to monitor patient advancement through interdisciplinary progression rounds. If new patient transition needs arise, please place a TOC consult.

## 2021-06-08 NOTE — Progress Notes (Signed)
Initial Nutrition Assessment  DOCUMENTATION CODES:   Non-severe (moderate) malnutrition in context of chronic illness  INTERVENTION:  Ensure Enlive po BID, each supplement provides 350 kcal and 20 grams of protein   Multivitamin daily   NUTRITION DIAGNOSIS:   Moderate Malnutrition related to chronic illness (pancreaticobiliary adenocarcinoma, squamous cell lung cancer, CAD, CKD3) as evidenced by per patient/family report, energy intake < 75% for > or equal to 1 month, mild fat depletion, moderate fat depletion, mild muscle depletion, moderate muscle depletion.   GOAL:  Patient will meet greater than or equal to 90% of their needs   MONITOR:  PO intake, Supplement acceptance, Labs, Weight trends  REASON FOR ASSESSMENT:   Malnutrition Screening Tool    ASSESSMENT: Patient is a 88 yo male with hx of pancreaticobiliary adenocarcinoma, squamous cell lung cancer, CAD, CKD3, gout and hypertension. He presents with chest pain.  Patient ate 50-75% of meals today. Poor dentition noted but patient denies chewing difficulty and "eats whatever he wants". Able to feed himself. His usual eating pattern is 1-2 meals daily at home. He occasionally drinks ONS.   Expect based on diet recall chronic suboptimal nutrition intake. Discussed the importance of a healthy eating pattern fruits, vegetables, protein and whole grains.   History of weight loss -reviewed records. He is under desirable BMI for age at current weight of 118.8 lb (54.1 kg). Patient self reports- "I used to weigh 150 lbs."   Medications: lipitor, jardiance, protonix  IV- NS@ 75 ml/hr  Labs: BMP Latest Ref Rng & Units 06/08/2021 06/07/2021 01/09/2016  Glucose 70 - 99 mg/dL 91 108(H) 121(H)  BUN 8 - 23 mg/dL 23 20 14   Creatinine 0.61 - 1.24 mg/dL 1.62(H) 1.57(H) 1.20  Sodium 135 - 145 mmol/L 137 137 134(L)  Potassium 3.5 - 5.1 mmol/L 4.2 4.0 4.5  Chloride 98 - 111 mmol/L 108 106 96(L)  CO2 22 - 32 mmol/L 23 23 -  Calcium 8.9  - 10.3 mg/dL 8.7(L) 8.8(L) -      NUTRITION - FOCUSED PHYSICAL EXAM:  Flowsheet Row Most Recent Value  Orbital Region Moderate depletion  Upper Arm Region Severe depletion  Thoracic and Lumbar Region Moderate depletion  Buccal Region Mild depletion  Temple Region Moderate depletion  Clavicle Bone Region Moderate depletion  Clavicle and Acromion Bone Region Mild depletion  Dorsal Hand Mild depletion  Edema (RD Assessment) Mild  Hair Reviewed  Eyes Reviewed  Mouth Reviewed  Skin Reviewed  Nails Reviewed      Diet Order:   Diet Order             Diet Heart Room service appropriate? Yes; Fluid consistency: Thin  Diet effective now                   EDUCATION NEEDS:  Education needs have been addressed  Skin:     Last BM:     Height:   Ht Readings from Last 1 Encounters:  06/07/21 5\' 6"  (1.676 m)    Weight:   Wt Readings from Last 1 Encounters:  06/07/21 54.1 kg    Ideal Body Weight:   59 kg  BMI:  Body mass index is 19.24 kg/m.  Estimated Nutritional Needs:   Kcal:  1890-2100  Protein:  64-68 gr  Fluid:  < 2 liters daily  Colman Cater MS,RD,CSG,LDN Contact: Shea Evans

## 2021-06-08 NOTE — Progress Notes (Signed)
Progress Note  Patient Name: John Shepard Date of Encounter: 06/08/2021  Primary Cardiologist: [facility]  Subjective   Still has intermittent, brief episodes of atypical chest discomfort without provocation.  Inpatient Medications    Scheduled Meds:  allopurinol  100 mg Oral Daily   aspirin EC  81 mg Oral Daily   atorvastatin  40 mg Oral q1800   darifenacin  7.5 mg Oral Daily   empagliflozin  10 mg Oral Daily   enoxaparin (LOVENOX) injection  30 mg Subcutaneous Q24H   feeding supplement  237 mL Oral BID BM   hydrALAZINE  25 mg Oral Q8H   metoprolol succinate  25 mg Oral Daily   pantoprazole  40 mg Oral Daily   Continuous Infusions:  sodium chloride 75 mL/hr at 06/08/21 0557   PRN Meds: acetaminophen **OR** acetaminophen, ondansetron **OR** ondansetron (ZOFRAN) IV   Vital Signs    Vitals:   06/07/21 1410 06/07/21 2128 06/08/21 0201 06/08/21 0541  BP: (!) 182/107 112/79 132/86 129/81  Pulse: 69 81 74 69  Resp: 20 20 18 20   Temp: 98.1 F (36.7 C) 98.8 F (37.1 C) 98.7 F (37.1 C) 98 F (36.7 C)  TempSrc: Oral Oral Oral   SpO2: 100% 99% 98% 99%  Weight:      Height:        Intake/Output Summary (Last 24 hours) at 06/08/2021 1011 Last data filed at 06/08/2021 0418 Gross per 24 hour  Intake 1138.3 ml  Output --  Net 1138.3 ml   Filed Weights   06/07/21 1046 06/07/21 1407  Weight: 68 kg 54.1 kg    Telemetry    Sinus rhythm.  Personally reviewed.  ECG    An ECG dated 06/07/2021 was personally reviewed today and demonstrated:  Sinus rhythm with LVH, left atrial enlargement, repolarization abnormalities.  Physical Exam   GEN: No acute distress.   Neck: No JVD. Cardiac: RRR, 1/6 systolic murmur and 3/6 diastolic murmur, no gallop.  Respiratory: Nonlabored. Clear to auscultation bilaterally. GI: Soft, nontender, bowel sounds present. MS: No edema.  Labs    Chemistry Recent Labs  Lab 06/07/21 1127 06/08/21 0505  NA 137 137  K 4.0 4.2   CL 106 108  CO2 23 23  GLUCOSE 108* 91  BUN 20 23  CREATININE 1.57* 1.62*  CALCIUM 8.8* 8.7*  GFRNONAA 43* 41*  ANIONGAP 8 6     Hematology Recent Labs  Lab 06/07/21 1127 06/08/21 0505  WBC 4.2 4.8  RBC 4.56 4.19*  HGB 13.8 12.1*  HCT 43.2 38.5*  MCV 94.7 91.9  MCH 30.3 28.9  MCHC 31.9 31.4  RDW 14.1 14.2  PLT 111* 109*    Cardiac Enzymes Recent Labs  Lab 06/07/21 1127 06/07/21 1320  TROPONINIHS 11 12    DDimer Recent Labs  Lab 06/07/21 1127  DDIMER 1.03*     Radiology    CT Angio Chest Pulmonary Embolism (PE) W or WO Contrast  Result Date: 06/07/2021 CLINICAL DATA:  Left sided chest pain x 2 days. Pulmonary embolism (PE) suspected, high prob chest pain. elevated D-dimer EXAM: CT ANGIOGRAPHY CHEST WITH CONTRAST TECHNIQUE: Multidetector CT imaging of the chest was performed using the standard protocol during bolus administration of intravenous contrast. Multiplanar CT image reconstructions and MIPs were obtained to evaluate the vascular anatomy. RADIATION DOSE REDUCTION: This exam was performed according to the departmental dose-optimization program which includes automated exposure control, adjustment of the mA and/or kV according to patient size and/or use of  iterative reconstruction technique. CONTRAST:  36mL OMNIPAQUE IOHEXOL 350 MG/ML SOLN COMPARISON:  Chest XR, 06/07/2021 and 05/22/2015. FINDINGS: Suboptimal evaluation, secondary to motion degradation. Cardiovascular: Satisfactory opacification of the pulmonary arteries to the segmental level. No segmental or larger pulmonary embolus. 3.6 cm ascending thoracic aorta. Poorly assessed filling defect appearance of the proximal descending thoracic aorta, with adjacent posterior mediastinal soft tissue thickening. Cardiomegaly with multi chamber enlargement. No pericardial effusion. A severe burden of multivessel coronary atherosclerosis is present. Main PA enlargement, measuring 3.3 cm likely reflecting underlying  pulmonary arterial hypertension. Mediastinum/Nodes: No enlarged mediastinal, hilar, or axillary lymph nodes. Enlarged, nodular appearance of the LEFT thyroid gland. Thyroid gland, trachea, and esophagus demonstrate no significant findings. Lungs/Pleura: Centrilobular emphysematous lung changes, greatest within the lung bases. LEFT apical pleural thickening. LEFT fissural thickening with calcification, no discrete mass lesion. 4 mm RIGHT upper lobe pulmonary nodule with internal calcification. No pleural effusion or pneumothorax. Upper Abdomen: Severely degraded for evaluation. Cholecystectomy clips. Colonic interposition at the LEFT upper quadrant. Musculoskeletal: Median sternotomy. No acute chest wall abnormality. Lower cervical spine degenerative change, incompletely imaged. No acute osseous findings. Review of the MIP images confirms the above findings. IMPRESSION: 1. No segmental or larger pulmonary embolus. 2. Main pulmonary arterial enlargement, reflecting underlying pulmonary arterial hypertension. 3. Filling defect appearance of the proximal descending thoracic aorta with posterior mediastinal soft tissue thickening. This is poorly assessed on this evaluation, and differential diagnosis includes dissection with intimal flap and intramural hematoma, however contrast mixing can appear similar. Recommend dedicated repeat CTA chest dissection protocol for further evaluation. 4. Centrilobular emphysematous lung changes with LEFT fissural thickening and calcifications. No discrete mass lesion. Attention on follow-up. 5. Incidental heterogeneous and enlarged LEFT thyroid. Recommend non-emergent, outpatient thyroid US if clinically warranted given patient age. Reference: J Am Coll Radiol. 2015 Feb;12(2): 143-50 These results were called by telephone at the time of interpretation on 06/07/2021 at 4:56 pm to provider DAVID TAT , who verbally acknowledged these results. Electronically Signed   By: Michaelle Birks M.D.    On: 06/07/2021 17:16   DG Chest Portable 1 View  Result Date: 06/07/2021 CLINICAL DATA:  Chest pain EXAM: PORTABLE CHEST 1 VIEW COMPARISON:  05/22/2015 FINDINGS: Sternotomy wires overlie normal cardiac silhouette. Ectatic aorta. Normal pulmonary vasculature. No effusion, infiltrate, or pneumothorax. No acute osseous abnormality. IMPRESSION: 1.  No acute cardiopulmonary process. 2. No change from prior. Electronically Signed   By: Suzy Bouchard M.D.   On: 06/07/2021 11:17   CT Angio Chest/Abd/Pel for Dissection W and/or W/WO  Result Date: 06/07/2021 CLINICAL DATA:  Chest pain or back pain. Aortic dissection suspected. Abnormal CTA chest with concern for aortic dissection. EXAM: CT ANGIOGRAPHY CHEST, ABDOMEN AND PELVIS TECHNIQUE: Non-contrast CT of the chest was initially obtained. Multidetector CT imaging through the chest, abdomen and pelvis was performed using the standard protocol during bolus administration of intravenous contrast. Multiplanar reconstructed images and MIPs were obtained and reviewed to evaluate the vascular anatomy. RADIATION DOSE REDUCTION: This exam was performed according to the departmental dose-optimization program which includes automated exposure control, adjustment of the mA and/or kV according to patient size and/or use of iterative reconstruction technique. CONTRAST:  Uniformly fills the lumen in this region without evidence of dissection or aneurysm. No abdominal aortic aneurysm. COMPARISON:  CTA chest 06/07/2021 earlier same day FINDINGS: CTA CHEST FINDINGS Cardiovascular: Heart size is mildly to moderately enlarged. No pericardial effusion. Dense coronary artery calcifications are seen. Moderate atherosclerotic calcifications within the thoracic aorta. Mediastinum/Nodes:  No enlarged mediastinal, hilar, or axillary lymph nodes. There is again enlarged and heterogeneous left thyroid gland with internal nodularity, not well evaluated on the current modality. Lungs/Pleura: The  central airways are patent. Mild-to-moderate centrilobular emphysematous changes, again greatest within lung bases. Mild biapical pleural scarring. Mild left major fissure linear thickening is unchanged from prior. No pleural effusion or pneumothorax. Musculoskeletal: Status post median sternotomy. Mild thoracic spine multilevel degenerative disc changes. Review of the MIP images confirms the above findings. CTA ABDOMEN AND PELVIS FINDINGS VASCULAR Aorta: No hyperdensity is seen on precontrast images to indicate an intramural hematoma within the thoracic or abdominal aorta. The ascending aorta measures up to 3.7 cm in caliber, ectatic but not aneurysmal. The descending thoracic aorta measures up to 3.1 cm in caliber to the level of the main pulmonary artery and 2.3 cm in caliber at the level of the aortic hiatus. The region of questioned filling defect within the proximal descending thoracic aorta on CTA chest earlier same day appears to have been secondary to mixing of contrasted and non contrasted blood. On the current study this region uniformly enhances without evidence of aortic dissection or aneurysm. Celiac: Patent without evidence of aneurysm, dissection, vasculitis or significant stenosis. SMA: Patent without evidence of aneurysm, dissection, vasculitis or significant stenosis. Renals: The left kidney and renal artery are surgically absent. The right renal artery is patent and within normal limits. IMA: An attenuated opacified inferior mesenteric artery is seen on axial series 6, images 121 through 151. Inflow: The common, internal, and external iliac arteries are patent. Veins: No obvious venous abnormality within the limitations of this arterial phase study. Review of the MIP images confirms the above findings. NON-VASCULAR Hepatobiliary: Minimally nodular contour of the liver. No focal arterial phase liver lesion is seen. The gallbladder appears to be surgically absent. There is again mild air within a  left hepatic lobe bile duct and within the common bile duct presumably secondary to prior surgery. Pancreas: Grossly unremarkable. Spleen: Unremarkable Adrenals/Urinary Tract: Normal adrenal glands. The left kidney appears to be surgically absent. There is an 9 mm low-attenuation lesion within the inferior pole of the left kidney, too small to further characterize but statistically most likely a cyst. Stomach/Bowel: The transverse colon extends into a region of moderate midline ventral rectus diastasis. The appendix is grossly unremarkable. No dilated loops of bowel to indicate bowel obstruction. Lymphatic: No abdominopelvic lymphadenopathy. Reproductive: The prostate is mildly enlarged. Other: Moderate left and very mild right inguinal hernias containing minimal small-bowel in the right and moderate sigmoid colon on the left. Musculoskeletal: Grade 1 anterolisthesis of L4 on L5 with severe L4-5 and moderate L5-S1 degenerative disc changes. Review of the MIP images confirms the above findings. IMPRESSION:: IMPRESSION: 1. No thoracic or abdominal aortic aneurysm or dissection. The appearance on CT of the chest earlier today likely reflected mixing of contrasted and non contrasted blood within the proximal descending thoracic aorta. 2. Mild-to-moderate cardiomegaly. Ectatic ascending aorta. Aortic Atherosclerosis (ICD10-I70.0). 3. Minimally nodular contour of the liver. This can be seen with cirrhosis. 4. Status post cholecystectomy. This surgery and possible ampullary intervention is likely the etiology for mild pneumobilia. 5. Moderate midline rectus diastasis. Moderate left and very mild right bowel containing inguinal hernias. No bowel obstruction. Electronically Signed   By: Yvonne Kendall   On: 06/07/2021 18:10    Cardiac Studies   Echocardiogram 10/21/2020 ([facility]): Summary    1. The left ventricle is normal in size with normal wall thickness.  2. The left ventricular systolic function is  moderately decreased, LVEF is  visually estimated at 40%.    3. There is decreased contractile function involving the mid inferior, mid  inferoseptal, basal inferior and basal inferoseptal segment(s).    4. There is grade I diastolic dysfunction (impaired relaxation).    5. The right ventricle is normal in size, with normal systolic function.    6. There is mild pulmonary hypertension, estimated pulmonary artery systolic  pressure is 41 mmHg.    7. The aorta at the sinuses of Valsalva and ascending aorta is mildly  dilated.   Assessment & Plan    1.  Recurrent atypical chest pain.  Cardiac enzymes are normal and ECG nonspecific with LVH and repolarization abnormalities.  Chest CT imaging from yesterday noted above, no evidence of pulmonary embolus or aortic dissection.  Echocardiogram is pending.  2.  Multivessel CAD status post CABG 1996.  Medical therapy planned at this point.  3.  History of ischemic cardiomyopathy with LVEF approximately 40% by echocardiogram in May 2022 at [facility].  Entresto and Aldactone held on admission.  He remains on Toprol-XL and Jardiance for now.  4.  Essential hypertension, started on hydralazine.  5.  CKD stage IIIb, creatinine 1.62 up from 1.57.  6.  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  7.  Squamous cell lung cancer status post XRT.  Follow-up on echocardiogram.  At this point anticipate continued medical therapy for treatment of ischemic heart disease and ischemic cardiomyopathy.  With recent angiography and CKD stage IIIb, agree with at least a temporary hold on Aldactone and Entresto, although these may be able to be resumed ultimately.  In the short-term would add Imdur to hydralazine to round out regimen.  Signed, Rozann Lesches, MD  06/08/2021, 10:11 AM

## 2021-06-08 NOTE — Progress Notes (Signed)
°  Echocardiogram 2D Echocardiogram has been performed.  John Shepard 06/08/2021, 11:26 AM

## 2021-06-09 DIAGNOSIS — E440 Moderate protein-calorie malnutrition: Secondary | ICD-10-CM | POA: Diagnosis not present

## 2021-06-09 DIAGNOSIS — N1832 Chronic kidney disease, stage 3b: Secondary | ICD-10-CM | POA: Diagnosis not present

## 2021-06-09 DIAGNOSIS — R079 Chest pain, unspecified: Secondary | ICD-10-CM | POA: Diagnosis not present

## 2021-06-09 DIAGNOSIS — C3490 Malignant neoplasm of unspecified part of unspecified bronchus or lung: Secondary | ICD-10-CM | POA: Diagnosis not present

## 2021-06-09 LAB — BASIC METABOLIC PANEL
Anion gap: 7 (ref 5–15)
BUN: 24 mg/dL — ABNORMAL HIGH (ref 8–23)
CO2: 21 mmol/L — ABNORMAL LOW (ref 22–32)
Calcium: 8.7 mg/dL — ABNORMAL LOW (ref 8.9–10.3)
Chloride: 107 mmol/L (ref 98–111)
Creatinine, Ser: 1.69 mg/dL — ABNORMAL HIGH (ref 0.61–1.24)
GFR, Estimated: 39 mL/min — ABNORMAL LOW (ref >60)
Glucose, Bld: 94 mg/dL (ref 70–99)
Potassium: 4 mmol/L (ref 3.5–5.1)
Sodium: 135 mmol/L (ref 135–145)

## 2021-06-09 MED ORDER — ADULT MULTIVITAMIN W/MINERALS CH
1.0000 | ORAL_TABLET | Freq: Every day | ORAL | Status: DC
  Administered 2021-06-09: 1 via ORAL
  Filled 2021-06-09: qty 1

## 2021-06-09 MED ORDER — OXYCODONE-ACETAMINOPHEN 5-325 MG PO TABS
1.0000 | ORAL_TABLET | Freq: Four times a day (QID) | ORAL | Status: DC | PRN
Start: 2021-06-09 — End: 2021-06-09
  Administered 2021-06-09: 1 via ORAL
  Filled 2021-06-09: qty 1

## 2021-06-09 MED ORDER — HYDRALAZINE HCL 25 MG PO TABS: 25.0000 mg | ORAL_TABLET | Freq: Three times a day (TID) | ORAL | 1 refills | Status: AC

## 2021-06-09 MED ORDER — ENSURE ENLIVE PO LIQD: 237.0000 mL | Freq: Two times a day (BID) | ORAL | 12 refills | Status: AC

## 2021-06-09 MED ORDER — OXYCODONE-ACETAMINOPHEN 5-325 MG PO TABS: 1.0000 | ORAL_TABLET | Freq: Four times a day (QID) | ORAL | 0 refills | Status: AC | PRN

## 2021-06-09 MED ORDER — ISOSORBIDE MONONITRATE ER 30 MG PO TB24: 15.0000 mg | ORAL_TABLET | Freq: Every day | ORAL | 1 refills | Status: AC

## 2021-06-09 NOTE — Discharge Instructions (Signed)
Nutrition Post Hospital Stay Proper nutrition can help your body recover from illness and injury.   Foods and beverages high in protein, vitamins, and minerals help rebuild muscle loss, promote healing, & reduce fall risk.   .In addition to eating healthy foods, a nutrition shake is an easy, delicious way to get the nutrition you need during and after your hospital stay  It is recommended that you continue to drink 2 bottles per day of:       Ensure for at least 1 month (30 days) after your hospital stay   Tips for adding a nutrition shake into your routine: As allowed, drink one with vitamins or medications instead of water or juice Enjoy one as a tasty mid-morning or afternoon snack Drink cold or make a milkshake out of it Drink one instead of milk with cereal or snacks Use as a coffee creamer   Available at the following grocery stores and pharmacies:           * [employer] * [employer] * [employer]  * [employer]          * [employer] * [employer]  * [facility]      * Target  * [employer]   * [facility]  * [employer]   * [facility] [phone]            For COUPONS visit: [email or web address]/join or [email or web address]   Suggested Substitutions Ensure Plus = Boost Plus = Carnation Breakfast Essentials = Boost Compact Ensure Active Clear = Boost Breeze Glucerna Shake = Boost Glucose Control = Carnation Breakfast Essentials SUGAR FREE       

## 2021-06-09 NOTE — Discharge Summary (Addendum)
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  Patient states that he has been having substernal and left-sided chest pain for the last 2 to 3 days.  He cannot relate any inciting factors.  He states that the pain primarily occurs at rest and is not any worse with exertion.  He denies any fevers, chills, coughing, hemoptysis, nausea, vomiting, diarrhea, dizziness, syncope.  He states that the chest pain lasts anywhere from 20 minutes to 1 hour.  It is sharp in nature without any radiation.  The patient went to urgent care today with chest pain and was directed to the emergency department for further evaluation. ED Patient was afebrile and hemodynamically stable with oxygen saturation 100% room air.  BMP showed sodium 137, potassium 4.0, bicarbonate 23, serum creatinine 1.57.  WBC 4.2, hemoglobin 13.8, platelets 111,000.  EKG shows sinus rhythm with nonspecific ST-T wave changes.  Chest x-ray was negative for any infiltrates.  Initial troponin was 11>>12.  Cardiology was consulted to assist.  Discharge Diagnoses:  Chest pain -Appears atypical by clinical history -Finish cycling troponins  11>>12 -Check D-dimer--1.06 -Cardiology consult appreciated>>continue medical therapy -TTE 03/30/18 LVEF 45-50% -History of CAD with three-vessel CABG 1996 -1/12 CTA chest--no PE but concerns for aortic dissection -1/12 CTA aorta--no dissection -06/08/21 echo EF 35-40%, G1DD, +WMA, PASP 59.3 -suspect musculoskeletal component vs mediastinal mass   CKD stage IIIb -Baseline creatinine 1.2-1.5 -Patient has history of left nephrectomy 1972, patient cannot recall why -repeat BMP in am -IVF x 24 hours after contrast load and monitored 48 hours after CTs -serum creatinine largely stable--1.69 on day of d/c   Hyperlipidemia -Continue statin -LDL 56   Essential hypertension -continue metoprolol succinate   Squamous cell carcinoma of the lung -Status post wedge resection January 09, 2018 -Patient likely has had a recurrence--finished palliative RT for mediastinal mass July 2022   Ampullary adenocarcinoma -Status post resection 03/26/2018 -decided to forego adjuvant chemotherapy for his ampullary malignancy after an informed discussion and started on an active surveillance program  -Patient follows at [facility] -Continue allopurinol   Chronic systolic CHF --TTE 44/0/10 LVEF 45-50% -clinically euvolemic -continue metoprolol succinate -continue Jardiance -hold Entresto and spiro temporarily due to contrast load and worsening renal function -hydralazine and imdur started by cardiology -he will need to follow up with cardiology in [place]  Moderate malnutrition -continue supplements         Discharge Instructions   Allergies as of 06/09/2021       Reactions   Penicillins Hives   Has patient had a PCN reaction causing immediate rash, facial/tongue/throat swelling, SOB or lightheadedness with hypotension: Yes Has patient had a PCN reaction causing severe rash involving mucus membranes or skin necrosis: No Has patient had a PCN reaction that required  hospitalization No  Has patient had a PCN reaction occurring within the last 10 years: No If all of the above answers are "NO", then may proceed with Cephalosporin use.        Medication List     STOP taking these medications    benzonatate 100 MG capsule Commonly known as: TESSALON   Entresto 24-26 MG Generic drug: sacubitril-valsartan   ondansetron 4 MG disintegrating tablet Commonly known as: Zofran ODT   predniSONE 10 MG tablet Commonly known as: DELTASONE   spironolactone 25 MG tablet Commonly known as: ALDACTONE       TAKE these medications    allopurinol 100 MG tablet Commonly known as: ZYLOPRIM Take 100 mg by mouth daily.   aspirin EC 81 MG tablet Take 81 mg by mouth daily.   atorvastatin 40 MG tablet Commonly known as: LIPITOR Take 40 mg by mouth daily.   feeding supplement Liqd Take 237 mLs by mouth 2 (two) times daily between meals.   furosemide 20 MG tablet Commonly known as: LASIX Take 20 mg by mouth daily.   hydrALAZINE 25 MG tablet Commonly known as: APRESOLINE Take 1 tablet (25 mg total) by mouth every 8 (eight) hours.   isosorbide mononitrate 30 MG 24 hr tablet Commonly known as: IMDUR Take 0.5 tablets (15 mg total) by mouth daily. Start taking on: June 10, 2021   Jardiance 10 MG Tabs tablet Generic drug: empagliflozin Take 10 mg by mouth daily.   lipase/protease/amylase 12000-38000 units Cpep capsule Commonly known as: CREON Take 12,000 Units by mouth 3 (three) times daily with meals.   metoprolol succinate 25 MG 24 hr tablet Commonly known as: TOPROL-XL Take 25 mg by mouth daily.   nitroGLYCERIN 0.4 MG SL tablet Commonly known as: Nitrostat Place 1 tablet (0.4 mg total) under the tongue every 5 (five) minutes as needed for chest pain (3 tablets maximum).   oxyCODONE-acetaminophen 5-325 MG tablet Commonly known as: PERCOCET/ROXICET Take 1 tablet by mouth every 6 (six) hours as needed for severe pain.   pantoprazole 40  MG tablet Commonly known as: PROTONIX Take 40 mg by mouth daily.   solifenacin 5 MG tablet Commonly known as: VESICARE Take 5 mg by mouth daily.        Allergies  Allergen Reactions   Penicillins Hives    Has patient had a PCN reaction causing immediate rash, facial/tongue/throat swelling, SOB or lightheadedness with hypotension: Yes Has patient had a PCN reaction causing severe rash involving mucus membranes or skin necrosis: No Has patient had a PCN reaction that required hospitalization No Has patient had a PCN reaction occurring within the last 10 years: No If all of the above answers are "NO", then may proceed with Cephalosporin use.     Consultations: cardiology   Procedures/Studies: CT Angio Chest Pulmonary Embolism (PE) W or WO Contrast  Result Date: 06/07/2021 CLINICAL DATA:  Left sided chest pain x 2 days. Pulmonary embolism (PE) suspected, high prob chest pain. elevated D-dimer EXAM: CT ANGIOGRAPHY CHEST WITH CONTRAST TECHNIQUE: Multidetector CT imaging of the chest was performed using the standard protocol during bolus administration of intravenous contrast. Multiplanar CT image reconstructions and MIPs were obtained to evaluate the vascular anatomy. RADIATION DOSE REDUCTION: This exam was performed according to the departmental dose-optimization program which includes automated exposure control, adjustment of the mA and/or kV according to patient size and/or use of iterative reconstruction technique. CONTRAST:  31mL OMNIPAQUE IOHEXOL 350 MG/ML SOLN COMPARISON:  Chest XR, 06/07/2021 and 05/22/2015. FINDINGS: Suboptimal evaluation, secondary  to motion degradation. Cardiovascular: Satisfactory opacification of the pulmonary arteries to the segmental level. No segmental or larger pulmonary embolus. 3.6 cm ascending thoracic aorta. Poorly assessed filling defect appearance of the proximal descending thoracic aorta, with adjacent posterior mediastinal soft tissue thickening.  Cardiomegaly with multi chamber enlargement. No pericardial effusion. A severe burden of multivessel coronary atherosclerosis is present. Main PA enlargement, measuring 3.3 cm likely reflecting underlying pulmonary arterial hypertension. Mediastinum/Nodes: No enlarged mediastinal, hilar, or axillary lymph nodes. Enlarged, nodular appearance of the LEFT thyroid gland. Thyroid gland, trachea, and esophagus demonstrate no significant findings. Lungs/Pleura: Centrilobular emphysematous lung changes, greatest within the lung bases. LEFT apical pleural thickening. LEFT fissural thickening with calcification, no discrete mass lesion. 4 mm RIGHT upper lobe pulmonary nodule with internal calcification. No pleural effusion or pneumothorax. Upper Abdomen: Severely degraded for evaluation. Cholecystectomy clips. Colonic interposition at the LEFT upper quadrant. Musculoskeletal: Median sternotomy. No acute chest wall abnormality. Lower cervical spine degenerative change, incompletely imaged. No acute osseous findings. Review of the MIP images confirms the above findings. IMPRESSION: 1. No segmental or larger pulmonary embolus. 2. Main pulmonary arterial enlargement, reflecting underlying pulmonary arterial hypertension. 3. Filling defect appearance of the proximal descending thoracic aorta with posterior mediastinal soft tissue thickening. This is poorly assessed on this evaluation, and differential diagnosis includes dissection with intimal flap and intramural hematoma, however contrast mixing can appear similar. Recommend dedicated repeat CTA chest dissection protocol for further evaluation. 4. Centrilobular emphysematous lung changes with LEFT fissural thickening and calcifications. No discrete mass lesion. Attention on follow-up. 5. Incidental heterogeneous and enlarged LEFT thyroid. Recommend non-emergent, outpatient thyroid US if clinically warranted given patient age. Reference: J Am Coll Radiol. 2015 Feb;12(2): 143-50  These results were called by telephone at the time of interpretation on 06/07/2021 at 4:56 pm to provider Eustacio Ellen , who verbally acknowledged these results. Electronically Signed   By: Michaelle Birks M.D.   On: 06/07/2021 17:16   DG Chest Portable 1 View  Result Date: 06/07/2021 CLINICAL DATA:  Chest pain EXAM: PORTABLE CHEST 1 VIEW COMPARISON:  05/22/2015 FINDINGS: Sternotomy wires overlie normal cardiac silhouette. Ectatic aorta. Normal pulmonary vasculature. No effusion, infiltrate, or pneumothorax. No acute osseous abnormality. IMPRESSION: 1.  No acute cardiopulmonary process. 2. No change from prior. Electronically Signed   By: Suzy Bouchard M.D.   On: 06/07/2021 11:17   ECHOCARDIOGRAM COMPLETE  Result Date: 06/08/2021    ECHOCARDIOGRAM REPORT   Patient Name:   John Shepard Date of Exam: 06/08/2021 Medical Rec #:  [id number]        Height:       66.0 in Accession #:    [id number]       Weight:       119.2 lb Date of Birth:  May 05, 1933        BSA:          1.605 m Patient Age:    54 years         BP:           129/81 mmHg Patient Gender: M                HR:           61 bpm. Exam Location:  [facility] Procedure: 2D Echo Indications:    chest pain  History:        Patient has no prior history of Echocardiogram examinations.  Prior CABG, chronic kidney disease; Risk Factors:Former Smoker,                 Hypertension and Dyslipidemia.  Sonographer:    Johny Chess RDCS Referring Phys: [phone] Locklyn Henriquez IMPRESSIONS  1. Left ventricular ejection fraction, by estimation, is 35 to 40%. The left ventricle has moderately decreased function. The left ventricle demonstrates regional wall motion abnormalities (see scoring diagram/findings for description). There is mild left ventricular hypertrophy. Left ventricular diastolic parameters are consistent with Grade I diastolic dysfunction (impaired relaxation).  2. Right ventricular systolic function is moderately reduced. The right ventricular  size is mildly enlarged. There is moderately elevated pulmonary artery systolic pressure. The estimated right ventricular systolic pressure is 09.8 mmHg.  3. Left atrial size was mildly dilated.  4. Right atrial size was upper normal.  5. The mitral valve is abnormal. Mild mitral valve regurgitation.  6. Tricuspid valve regurgitation is moderate.  7. The aortic valve is tricuspid. Aortic valve regurgitation is trivial. Aortic valve sclerosis/calcification is present, without any evidence of aortic stenosis.  8. The inferior vena cava is normal in size with <50% respiratory variability, suggesting right atrial pressure of 8 mmHg. Comparison(s): Prior images unable to be directly viewed, comparison made by report only. FINDINGS  Left Ventricle: Left ventricular ejection fraction, by estimation, is 35 to 40%. The left ventricle has moderately decreased function. The left ventricle demonstrates regional wall motion abnormalities. The left ventricular internal cavity size was normal in size. There is mild left ventricular hypertrophy. Left ventricular diastolic parameters are consistent with Grade I diastolic dysfunction (impaired relaxation).  LV Wall Scoring: The basal inferolateral segment, basal anterolateral segment, and basal inferior segment are akinetic. The entire anterior wall, mid and distal lateral wall, entire septum, entire apex, mid and distal inferior wall, and mid anterolateral segment are hypokinetic. Right Ventricle: The right ventricular size is mildly enlarged. No increase in right ventricular wall thickness. Right ventricular systolic function is moderately reduced. There is moderately elevated pulmonary artery systolic pressure. The tricuspid regurgitant velocity is 3.58 m/s, and with an assumed right atrial pressure of 8 mmHg, the estimated right ventricular systolic pressure is 11.9 mmHg. Left Atrium: Left atrial size was mildly dilated. Right Atrium: Right atrial size was upper normal.  Pericardium: There is no evidence of pericardial effusion. Mitral Valve: The mitral valve is abnormal. There is moderate thickening of the mitral valve leaflet(s). Mild mitral valve regurgitation. Tricuspid Valve: The tricuspid valve is grossly normal. Tricuspid valve regurgitation is moderate. Aortic Valve: The aortic valve is tricuspid. There is mild aortic valve annular calcification. Aortic valve regurgitation is trivial. Aortic valve sclerosis/calcification is present, without any evidence of aortic stenosis. Pulmonic Valve: The pulmonic valve was grossly normal. Pulmonic valve regurgitation is mild. Aorta: The aortic root is normal in size and structure. Venous: The inferior vena cava is normal in size with less than 50% respiratory variability, suggesting right atrial pressure of 8 mmHg. IAS/Shunts: No atrial level shunt detected by color flow Doppler.  LEFT VENTRICLE PLAX 2D LVIDd:         5.00 cm      Diastology LVIDs:         4.10 cm      LV e' medial:    3.05 cm/s LV PW:         1.10 cm      LV E/e' medial:  14.6 LV IVS:        1.10 cm  LV e' lateral:   4.46 cm/s LVOT diam:     2.40 cm      LV E/e' lateral: 10.0 LV SV:         60 LV SV Index:   37 LVOT Area:     4.52 cm  LV Volumes (MOD) LV vol d, MOD A4C: 128.0 ml LV vol s, MOD A4C: 85.8 ml LV SV MOD A4C:     128.0 ml RIGHT VENTRICLE            IVC RV Basal diam:  3.50 cm    IVC diam: 2.00 cm RV S prime:     8.92 cm/s TAPSE (M-mode): 1.2 cm LEFT ATRIUM             Index        RIGHT ATRIUM           Index LA diam:        3.00 cm 1.87 cm/m   RA Area:     19.00 cm LA Vol (A2C):   57.6 ml 35.89 ml/m  RA Volume:   53.50 ml  33.33 ml/m LA Vol (A4C):   47.4 ml 29.53 ml/m LA Biplane Vol: 56.1 ml 34.95 ml/m  AORTIC VALVE LVOT Vmax:   69.00 cm/s LVOT Vmean:  43.300 cm/s LVOT VTI:    0.133 m  AORTA Ao Root diam: 3.80 cm Ao Asc diam:  3.20 cm MITRAL VALVE               TRICUSPID VALVE MV Area (PHT): 2.66 cm    TR Peak grad:   51.3 mmHg MV Decel Time:  285 msec    TR Vmax:        358.00 cm/s MV E velocity: 44.60 cm/s MV A velocity: 65.60 cm/s  SHUNTS MV E/A ratio:  0.68        Systemic VTI:  0.13 m                            Systemic Diam: 2.40 cm Rozann Lesches MD Electronically signed by Rozann Lesches MD Signature Date/Time: 06/08/2021/11:50:42 AM    Final    CT Angio Chest/Abd/Pel for Dissection W and/or W/WO  Result Date: 06/07/2021 CLINICAL DATA:  Chest pain or back pain. Aortic dissection suspected. Abnormal CTA chest with concern for aortic dissection. EXAM: CT ANGIOGRAPHY CHEST, ABDOMEN AND PELVIS TECHNIQUE: Non-contrast CT of the chest was initially obtained. Multidetector CT imaging through the chest, abdomen and pelvis was performed using the standard protocol during bolus administration of intravenous contrast. Multiplanar reconstructed images and MIPs were obtained and reviewed to evaluate the vascular anatomy. RADIATION DOSE REDUCTION: This exam was performed according to the departmental dose-optimization program which includes automated exposure control, adjustment of the mA and/or kV according to patient size and/or use of iterative reconstruction technique. CONTRAST:  Uniformly fills the lumen in this region without evidence of dissection or aneurysm. No abdominal aortic aneurysm. COMPARISON:  CTA chest 06/07/2021 earlier same day FINDINGS: CTA CHEST FINDINGS Cardiovascular: Heart size is mildly to moderately enlarged. No pericardial effusion. Dense coronary artery calcifications are seen. Moderate atherosclerotic calcifications within the thoracic aorta. Mediastinum/Nodes: No enlarged mediastinal, hilar, or axillary lymph nodes. There is again enlarged and heterogeneous left thyroid gland with internal nodularity, not well evaluated on the current modality. Lungs/Pleura: The central airways are patent. Mild-to-moderate centrilobular emphysematous changes, again greatest within lung bases. Mild biapical pleural scarring. Mild left major  fissure  linear thickening is unchanged from prior. No pleural effusion or pneumothorax. Musculoskeletal: Status post median sternotomy. Mild thoracic spine multilevel degenerative disc changes. Review of the MIP images confirms the above findings. CTA ABDOMEN AND PELVIS FINDINGS VASCULAR Aorta: No hyperdensity is seen on precontrast images to indicate an intramural hematoma within the thoracic or abdominal aorta. The ascending aorta measures up to 3.7 cm in caliber, ectatic but not aneurysmal. The descending thoracic aorta measures up to 3.1 cm in caliber to the level of the main pulmonary artery and 2.3 cm in caliber at the level of the aortic hiatus. The region of questioned filling defect within the proximal descending thoracic aorta on CTA chest earlier same day appears to have been secondary to mixing of contrasted and non contrasted blood. On the current study this region uniformly enhances without evidence of aortic dissection or aneurysm. Celiac: Patent without evidence of aneurysm, dissection, vasculitis or significant stenosis. SMA: Patent without evidence of aneurysm, dissection, vasculitis or significant stenosis. Renals: The left kidney and renal artery are surgically absent. The right renal artery is patent and within normal limits. IMA: An attenuated opacified inferior mesenteric artery is seen on axial series 6, images 121 through 151. Inflow: The common, internal, and external iliac arteries are patent. Veins: No obvious venous abnormality within the limitations of this arterial phase study. Review of the MIP images confirms the above findings. NON-VASCULAR Hepatobiliary: Minimally nodular contour of the liver. No focal arterial phase liver lesion is seen. The gallbladder appears to be surgically absent. There is again mild air within a left hepatic lobe bile duct and within the common bile duct presumably secondary to prior surgery. Pancreas: Grossly unremarkable. Spleen: Unremarkable  Adrenals/Urinary Tract: Normal adrenal glands. The left kidney appears to be surgically absent. There is an 9 mm low-attenuation lesion within the inferior pole of the left kidney, too small to further characterize but statistically most likely a cyst. Stomach/Bowel: The transverse colon extends into a region of moderate midline ventral rectus diastasis. The appendix is grossly unremarkable. No dilated loops of bowel to indicate bowel obstruction. Lymphatic: No abdominopelvic lymphadenopathy. Reproductive: The prostate is mildly enlarged. Other: Moderate left and very mild right inguinal hernias containing minimal small-bowel in the right and moderate sigmoid colon on the left. Musculoskeletal: Grade 1 anterolisthesis of L4 on L5 with severe L4-5 and moderate L5-S1 degenerative disc changes. Review of the MIP images confirms the above findings. IMPRESSION:: IMPRESSION: 1. No thoracic or abdominal aortic aneurysm or dissection. The appearance on CT of the chest earlier today likely reflected mixing of contrasted and non contrasted blood within the proximal descending thoracic aorta. 2. Mild-to-moderate cardiomegaly. Ectatic ascending aorta. Aortic Atherosclerosis (ICD10-I70.0). 3. Minimally nodular contour of the liver. This can be seen with cirrhosis. 4. Status post cholecystectomy. This surgery and possible ampullary intervention is likely the etiology for mild pneumobilia. 5. Moderate midline rectus diastasis. Moderate left and very mild right bowel containing inguinal hernias. No bowel obstruction. Electronically Signed   By: Yvonne Kendall   On: 06/07/2021 18:10        Discharge Exam: Vitals:   06/09/21 0515 06/09/21 0825  BP: (!) 150/83 132/79  Pulse: 68 63  Resp: 16   Temp: 97.8 F (36.6 C)   SpO2: 99%    Vitals:   06/08/21 1941 06/09/21 0043 06/09/21 0515 06/09/21 0825  BP: (!) 147/85 125/74 (!) 150/83 132/79  Pulse: 62 65 68 63  Resp: 16 16 16    Temp: 98 F (36.7 C)  98.2 F (36.8 C)  97.8 F (36.6 C)   TempSrc: Oral Oral Oral   SpO2: 99% 97% 99%   Weight:      Height:        General: Pt is alert, awake, not in acute distress Cardiovascular: RRR, S1/S2 +, no rubs, no gallops Respiratory: bibasilar rales. No wheeze Abdominal: Soft, NT, ND, bowel sounds + Extremities: no edema, no cyanosis   The results of significant diagnostics from this hospitalization (including imaging, microbiology, ancillary and laboratory) are listed below for reference.    Significant Diagnostic Studies: CT Angio Chest Pulmonary Embolism (PE) W or WO Contrast  Result Date: 06/07/2021 CLINICAL DATA:  Left sided chest pain x 2 days. Pulmonary embolism (PE) suspected, high prob chest pain. elevated D-dimer EXAM: CT ANGIOGRAPHY CHEST WITH CONTRAST TECHNIQUE: Multidetector CT imaging of the chest was performed using the standard protocol during bolus administration of intravenous contrast. Multiplanar CT image reconstructions and MIPs were obtained to evaluate the vascular anatomy. RADIATION DOSE REDUCTION: This exam was performed according to the departmental dose-optimization program which includes automated exposure control, adjustment of the mA and/or kV according to patient size and/or use of iterative reconstruction technique. CONTRAST:  6mL OMNIPAQUE IOHEXOL 350 MG/ML SOLN COMPARISON:  Chest XR, 06/07/2021 and 05/22/2015. FINDINGS: Suboptimal evaluation, secondary to motion degradation. Cardiovascular: Satisfactory opacification of the pulmonary arteries to the segmental level. No segmental or larger pulmonary embolus. 3.6 cm ascending thoracic aorta. Poorly assessed filling defect appearance of the proximal descending thoracic aorta, with adjacent posterior mediastinal soft tissue thickening. Cardiomegaly with multi chamber enlargement. No pericardial effusion. A severe burden of multivessel coronary atherosclerosis is present. Main PA enlargement, measuring 3.3 cm likely reflecting underlying  pulmonary arterial hypertension. Mediastinum/Nodes: No enlarged mediastinal, hilar, or axillary lymph nodes. Enlarged, nodular appearance of the LEFT thyroid gland. Thyroid gland, trachea, and esophagus demonstrate no significant findings. Lungs/Pleura: Centrilobular emphysematous lung changes, greatest within the lung bases. LEFT apical pleural thickening. LEFT fissural thickening with calcification, no discrete mass lesion. 4 mm RIGHT upper lobe pulmonary nodule with internal calcification. No pleural effusion or pneumothorax. Upper Abdomen: Severely degraded for evaluation. Cholecystectomy clips. Colonic interposition at the LEFT upper quadrant. Musculoskeletal: Median sternotomy. No acute chest wall abnormality. Lower cervical spine degenerative change, incompletely imaged. No acute osseous findings. Review of the MIP images confirms the above findings. IMPRESSION: 1. No segmental or larger pulmonary embolus. 2. Main pulmonary arterial enlargement, reflecting underlying pulmonary arterial hypertension. 3. Filling defect appearance of the proximal descending thoracic aorta with posterior mediastinal soft tissue thickening. This is poorly assessed on this evaluation, and differential diagnosis includes dissection with intimal flap and intramural hematoma, however contrast mixing can appear similar. Recommend dedicated repeat CTA chest dissection protocol for further evaluation. 4. Centrilobular emphysematous lung changes with LEFT fissural thickening and calcifications. No discrete mass lesion. Attention on follow-up. 5. Incidental heterogeneous and enlarged LEFT thyroid. Recommend non-emergent, outpatient thyroid US if clinically warranted given patient age. Reference: J Am Coll Radiol. 2015 Feb;12(2): 143-50 These results were called by telephone at the time of interpretation on 06/07/2021 at 4:56 pm to provider Kerington Hildebrant , who verbally acknowledged these results. Electronically Signed   By: Michaelle Birks M.D.    On: 06/07/2021 17:16   DG Chest Portable 1 View  Result Date: 06/07/2021 CLINICAL DATA:  Chest pain EXAM: PORTABLE CHEST 1 VIEW COMPARISON:  05/22/2015 FINDINGS: Sternotomy wires overlie normal cardiac silhouette. Ectatic aorta. Normal pulmonary vasculature. No effusion, infiltrate, or pneumothorax. No acute osseous  abnormality. IMPRESSION: 1.  No acute cardiopulmonary process. 2. No change from prior. Electronically Signed   By: Suzy Bouchard M.D.   On: 06/07/2021 11:17   ECHOCARDIOGRAM COMPLETE  Result Date: 06/08/2021    ECHOCARDIOGRAM REPORT   Patient Name:   John Shepard Date of Exam: 06/08/2021 Medical Rec #:  [id number]        Height:       66.0 in Accession #:    [id number]       Weight:       119.2 lb Date of Birth:  10-Jul-1932        BSA:          1.605 m Patient Age:    88 years         BP:           129/81 mmHg Patient Gender: M                HR:           61 bpm. Exam Location:  [facility] Procedure: 2D Echo Indications:    chest pain  History:        Patient has no prior history of Echocardiogram examinations.                 Prior CABG, chronic kidney disease; Risk Factors:Former Smoker,                 Hypertension and Dyslipidemia.  Sonographer:    Johny Chess RDCS Referring Phys: [phone] Sherre Wooton IMPRESSIONS  1. Left ventricular ejection fraction, by estimation, is 35 to 40%. The left ventricle has moderately decreased function. The left ventricle demonstrates regional wall motion abnormalities (see scoring diagram/findings for description). There is mild left ventricular hypertrophy. Left ventricular diastolic parameters are consistent with Grade I diastolic dysfunction (impaired relaxation).  2. Right ventricular systolic function is moderately reduced. The right ventricular size is mildly enlarged. There is moderately elevated pulmonary artery systolic pressure. The estimated right ventricular systolic pressure is 20.9 mmHg.  3. Left atrial size was mildly dilated.  4. Right  atrial size was upper normal.  5. The mitral valve is abnormal. Mild mitral valve regurgitation.  6. Tricuspid valve regurgitation is moderate.  7. The aortic valve is tricuspid. Aortic valve regurgitation is trivial. Aortic valve sclerosis/calcification is present, without any evidence of aortic stenosis.  8. The inferior vena cava is normal in size with <50% respiratory variability, suggesting right atrial pressure of 8 mmHg. Comparison(s): Prior images unable to be directly viewed, comparison made by report only. FINDINGS  Left Ventricle: Left ventricular ejection fraction, by estimation, is 35 to 40%. The left ventricle has moderately decreased function. The left ventricle demonstrates regional wall motion abnormalities. The left ventricular internal cavity size was normal in size. There is mild left ventricular hypertrophy. Left ventricular diastolic parameters are consistent with Grade I diastolic dysfunction (impaired relaxation).  LV Wall Scoring: The basal inferolateral segment, basal anterolateral segment, and basal inferior segment are akinetic. The entire anterior wall, mid and distal lateral wall, entire septum, entire apex, mid and distal inferior wall, and mid anterolateral segment are hypokinetic. Right Ventricle: The right ventricular size is mildly enlarged. No increase in right ventricular wall thickness. Right ventricular systolic function is moderately reduced. There is moderately elevated pulmonary artery systolic pressure. The tricuspid regurgitant velocity is 3.58 m/s, and with an assumed right atrial pressure of 8 mmHg, the estimated right ventricular systolic pressure is 47.0 mmHg. Left Atrium:  Left atrial size was mildly dilated. Right Atrium: Right atrial size was upper normal. Pericardium: There is no evidence of pericardial effusion. Mitral Valve: The mitral valve is abnormal. There is moderate thickening of the mitral valve leaflet(s). Mild mitral valve regurgitation. Tricuspid Valve:  The tricuspid valve is grossly normal. Tricuspid valve regurgitation is moderate. Aortic Valve: The aortic valve is tricuspid. There is mild aortic valve annular calcification. Aortic valve regurgitation is trivial. Aortic valve sclerosis/calcification is present, without any evidence of aortic stenosis. Pulmonic Valve: The pulmonic valve was grossly normal. Pulmonic valve regurgitation is mild. Aorta: The aortic root is normal in size and structure. Venous: The inferior vena cava is normal in size with less than 50% respiratory variability, suggesting right atrial pressure of 8 mmHg. IAS/Shunts: No atrial level shunt detected by color flow Doppler.  LEFT VENTRICLE PLAX 2D LVIDd:         5.00 cm      Diastology LVIDs:         4.10 cm      LV e' medial:    3.05 cm/s LV PW:         1.10 cm      LV E/e' medial:  14.6 LV IVS:        1.10 cm      LV e' lateral:   4.46 cm/s LVOT diam:     2.40 cm      LV E/e' lateral: 10.0 LV SV:         60 LV SV Index:   37 LVOT Area:     4.52 cm  LV Volumes (MOD) LV vol d, MOD A4C: 128.0 ml LV vol s, MOD A4C: 85.8 ml LV SV MOD A4C:     128.0 ml RIGHT VENTRICLE            IVC RV Basal diam:  3.50 cm    IVC diam: 2.00 cm RV S prime:     8.92 cm/s TAPSE (M-mode): 1.2 cm LEFT ATRIUM             Index        RIGHT ATRIUM           Index LA diam:        3.00 cm 1.87 cm/m   RA Area:     19.00 cm LA Vol (A2C):   57.6 ml 35.89 ml/m  RA Volume:   53.50 ml  33.33 ml/m LA Vol (A4C):   47.4 ml 29.53 ml/m LA Biplane Vol: 56.1 ml 34.95 ml/m  AORTIC VALVE LVOT Vmax:   69.00 cm/s LVOT Vmean:  43.300 cm/s LVOT VTI:    0.133 m  AORTA Ao Root diam: 3.80 cm Ao Asc diam:  3.20 cm MITRAL VALVE               TRICUSPID VALVE MV Area (PHT): 2.66 cm    TR Peak grad:   51.3 mmHg MV Decel Time: 285 msec    TR Vmax:        358.00 cm/s MV E velocity: 44.60 cm/s MV A velocity: 65.60 cm/s  SHUNTS MV E/A ratio:  0.68        Systemic VTI:  0.13 m                            Systemic Diam: 2.40 cm Rozann Lesches  MD Electronically signed by Rozann Lesches MD Signature Date/Time: 06/08/2021/11:50:42 AM    Final  CT Angio Chest/Abd/Pel for Dissection W and/or W/WO  Result Date: 06/07/2021 CLINICAL DATA:  Chest pain or back pain. Aortic dissection suspected. Abnormal CTA chest with concern for aortic dissection. EXAM: CT ANGIOGRAPHY CHEST, ABDOMEN AND PELVIS TECHNIQUE: Non-contrast CT of the chest was initially obtained. Multidetector CT imaging through the chest, abdomen and pelvis was performed using the standard protocol during bolus administration of intravenous contrast. Multiplanar reconstructed images and MIPs were obtained and reviewed to evaluate the vascular anatomy. RADIATION DOSE REDUCTION: This exam was performed according to the departmental dose-optimization program which includes automated exposure control, adjustment of the mA and/or kV according to patient size and/or use of iterative reconstruction technique. CONTRAST:  Uniformly fills the lumen in this region without evidence of dissection or aneurysm. No abdominal aortic aneurysm. COMPARISON:  CTA chest 06/07/2021 earlier same day FINDINGS: CTA CHEST FINDINGS Cardiovascular: Heart size is mildly to moderately enlarged. No pericardial effusion. Dense coronary artery calcifications are seen. Moderate atherosclerotic calcifications within the thoracic aorta. Mediastinum/Nodes: No enlarged mediastinal, hilar, or axillary lymph nodes. There is again enlarged and heterogeneous left thyroid gland with internal nodularity, not well evaluated on the current modality. Lungs/Pleura: The central airways are patent. Mild-to-moderate centrilobular emphysematous changes, again greatest within lung bases. Mild biapical pleural scarring. Mild left major fissure linear thickening is unchanged from prior. No pleural effusion or pneumothorax. Musculoskeletal: Status post median sternotomy. Mild thoracic spine multilevel degenerative disc changes. Review of the MIP images  confirms the above findings. CTA ABDOMEN AND PELVIS FINDINGS VASCULAR Aorta: No hyperdensity is seen on precontrast images to indicate an intramural hematoma within the thoracic or abdominal aorta. The ascending aorta measures up to 3.7 cm in caliber, ectatic but not aneurysmal. The descending thoracic aorta measures up to 3.1 cm in caliber to the level of the main pulmonary artery and 2.3 cm in caliber at the level of the aortic hiatus. The region of questioned filling defect within the proximal descending thoracic aorta on CTA chest earlier same day appears to have been secondary to mixing of contrasted and non contrasted blood. On the current study this region uniformly enhances without evidence of aortic dissection or aneurysm. Celiac: Patent without evidence of aneurysm, dissection, vasculitis or significant stenosis. SMA: Patent without evidence of aneurysm, dissection, vasculitis or significant stenosis. Renals: The left kidney and renal artery are surgically absent. The right renal artery is patent and within normal limits. IMA: An attenuated opacified inferior mesenteric artery is seen on axial series 6, images 121 through 151. Inflow: The common, internal, and external iliac arteries are patent. Veins: No obvious venous abnormality within the limitations of this arterial phase study. Review of the MIP images confirms the above findings. NON-VASCULAR Hepatobiliary: Minimally nodular contour of the liver. No focal arterial phase liver lesion is seen. The gallbladder appears to be surgically absent. There is again mild air within a left hepatic lobe bile duct and within the common bile duct presumably secondary to prior surgery. Pancreas: Grossly unremarkable. Spleen: Unremarkable Adrenals/Urinary Tract: Normal adrenal glands. The left kidney appears to be surgically absent. There is an 9 mm low-attenuation lesion within the inferior pole of the left kidney, too small to further characterize but statistically  most likely a cyst. Stomach/Bowel: The transverse colon extends into a region of moderate midline ventral rectus diastasis. The appendix is grossly unremarkable. No dilated loops of bowel to indicate bowel obstruction. Lymphatic: No abdominopelvic lymphadenopathy. Reproductive: The prostate is mildly enlarged. Other: Moderate left and very mild right inguinal hernias  containing minimal small-bowel in the right and moderate sigmoid colon on the left. Musculoskeletal: Grade 1 anterolisthesis of L4 on L5 with severe L4-5 and moderate L5-S1 degenerative disc changes. Review of the MIP images confirms the above findings. IMPRESSION:: IMPRESSION: 1. No thoracic or abdominal aortic aneurysm or dissection. The appearance on CT of the chest earlier today likely reflected mixing of contrasted and non contrasted blood within the proximal descending thoracic aorta. 2. Mild-to-moderate cardiomegaly. Ectatic ascending aorta. Aortic Atherosclerosis (ICD10-I70.0). 3. Minimally nodular contour of the liver. This can be seen with cirrhosis. 4. Status post cholecystectomy. This surgery and possible ampullary intervention is likely the etiology for mild pneumobilia. 5. Moderate midline rectus diastasis. Moderate left and very mild right bowel containing inguinal hernias. No bowel obstruction. Electronically Signed   By: Yvonne Kendall   On: 06/07/2021 18:10    Microbiology: Recent Results (from the past 240 hour(s))  Resp Panel by RT-PCR (Flu A&B, Covid) Nasopharyngeal Swab     Status: None   Collection Time: 06/07/21  1:52 PM   Specimen: Nasopharyngeal Swab; Nasopharyngeal(NP) swabs in vial transport medium  Result Value Ref Range Status   SARS Coronavirus 2 by RT PCR NEGATIVE NEGATIVE Final    Comment: (NOTE) SARS-CoV-2 target nucleic acids are NOT DETECTED.  The SARS-CoV-2 RNA is generally detectable in upper respiratory specimens during the acute phase of infection. The lowest concentration of SARS-CoV-2 viral copies  this assay can detect is 138 copies/mL. A negative result does not preclude SARS-Cov-2 infection and should not be used as the sole basis for treatment or other patient management decisions. A negative result may occur with  improper specimen collection/handling, submission of specimen other than nasopharyngeal swab, presence of viral mutation(s) within the areas targeted by this assay, and inadequate number of viral copies(<138 copies/mL). A negative result must be combined with clinical observations, patient history, and epidemiological information. The expected result is Negative.  Fact Sheet for Patients:  EntrepreneurPulse.com.au  Fact Sheet for Healthcare Providers:  IncredibleEmployment.be  This test is no t yet approved or cleared by the [place] FDA and  has been authorized for detection and/or diagnosis of SARS-CoV-2 by FDA under an Emergency Use Authorization (EUA). This EUA will remain  in effect (meaning this test can be used) for the duration of the COVID-19 declaration under Section 564(b)(1) of the Act, 21 U.S.C.section 360bbb-3(b)(1), unless the authorization is terminated  or revoked sooner.       Influenza A by PCR NEGATIVE NEGATIVE Final   Influenza B by PCR NEGATIVE NEGATIVE Final    Comment: (NOTE) The Xpert Xpress SARS-CoV-2/FLU/RSV plus assay is intended as an aid in the diagnosis of influenza from Nasopharyngeal swab specimens and should not be used as a sole basis for treatment. Nasal washings and aspirates are unacceptable for Xpert Xpress SARS-CoV-2/FLU/RSV testing.  Fact Sheet for Patients: EntrepreneurPulse.com.au  Fact Sheet for Healthcare Providers: IncredibleEmployment.be  This test is not yet approved or cleared by the [place] FDA and has been authorized for detection and/or diagnosis of SARS-CoV-2 by FDA under an Emergency Use Authorization (EUA). This EUA  will remain in effect (meaning this test can be used) for the duration of the COVID-19 declaration under Section 564(b)(1) of the Act, 21 U.S.C. section 360bbb-3(b)(1), unless the authorization is terminated or revoked.  Performed at [facility], [address], [place], [place] [address]      Labs: Basic Metabolic Panel: Recent Labs  Lab 06/07/21 1127 06/08/21 0505 06/09/21 0452  NA  137 137 135  K 4.0 4.2 4.0  CL 106 108 107  CO2 23 23 21*  GLUCOSE 108* 91 94  BUN 20 23 24*  CREATININE 1.57* 1.62* 1.69*  CALCIUM 8.8* 8.7* 8.7*   Liver Function Tests: No results for input(s): AST, ALT, ALKPHOS, BILITOT, PROT, ALBUMIN in the last 168 hours. Recent Labs  Lab 06/07/21 1127  LIPASE 38   No results for input(s): AMMONIA in the last 168 hours. CBC: Recent Labs  Lab 06/07/21 1127 06/08/21 0505  WBC 4.2 4.8  NEUTROABS 3.2  --   HGB 13.8 12.1*  HCT 43.2 38.5*  MCV 94.7 91.9  PLT 111* 109*   Cardiac Enzymes: No results for input(s): CKTOTAL, CKMB, CKMBINDEX, TROPONINI in the last 168 hours. BNP: Invalid input(s): POCBNP CBG: Recent Labs  Lab 06/07/21 1304  GLUCAP 82    Time coordinating discharge:  36 minutes  Signed:  Orson Eva, DO [facility] Pager: [phone] 06/09/2021, 10:20 AM

## 2021-06-09 NOTE — Progress Notes (Signed)
Went to discharge patient and patient states that they are still having chest pain. MD Tat notified of chest pain. MD Tat states to give percocet and discharge.

## 2022-02-24 DEATH — deceased

## 2022-06-06 IMAGING — CT CT ANGIO CHEST-ABD-PELV FOR DISSECTION W/ AND WO/W CM
2 of 7 series · 12 of 46 positions shown, 14 images · non-contrast
Comparison: CTA chest 06/07/2021 earlier same day

CLINICAL DATA: Chest pain or back pain. Aortic dissection
suspected. Abnormal CTA chest with concern for aortic dissection.

EXAM:
CT ANGIOGRAPHY CHEST, ABDOMEN AND PELVIS
TECHNIQUE: Non-contrast CT of the chest was initially obtained.

[Series 6: axial arterial · axial · arterial · 0.80mm/px · z∈[-520,+32]mm · 9 of 218 slices shown, 11 images]
[im 17/218  soft-tissue]
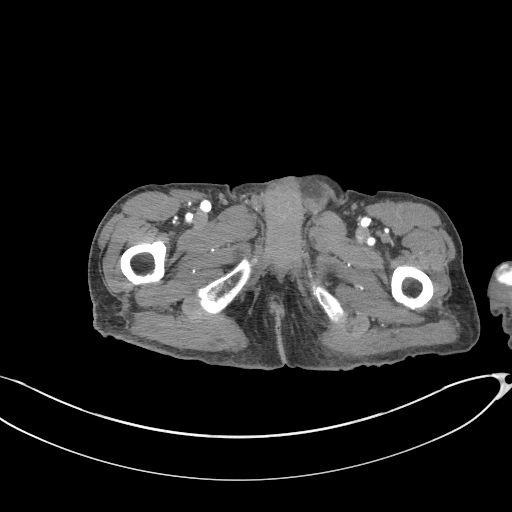
[im 17/218  bone]
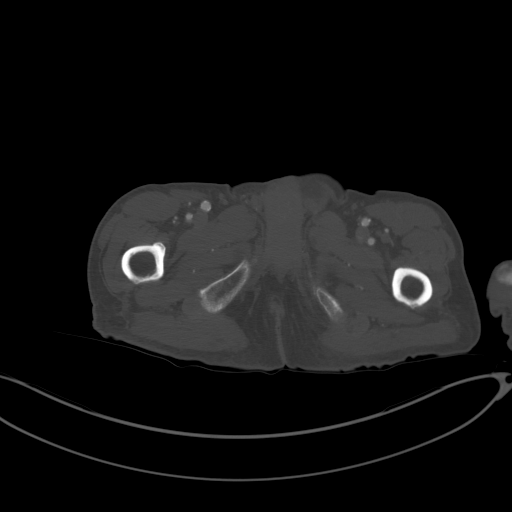
[im 51/218  soft-tissue]
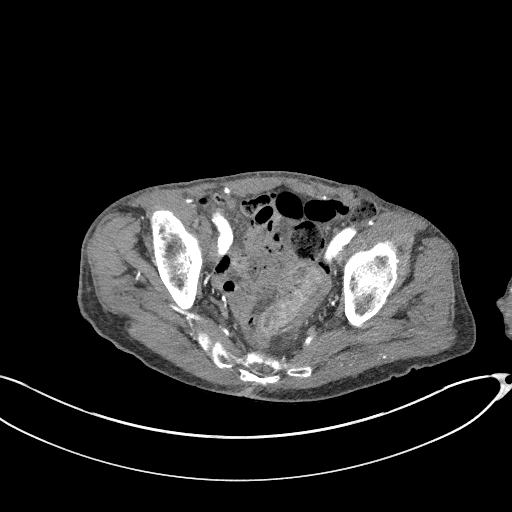
[im 67/218  soft-tissue]
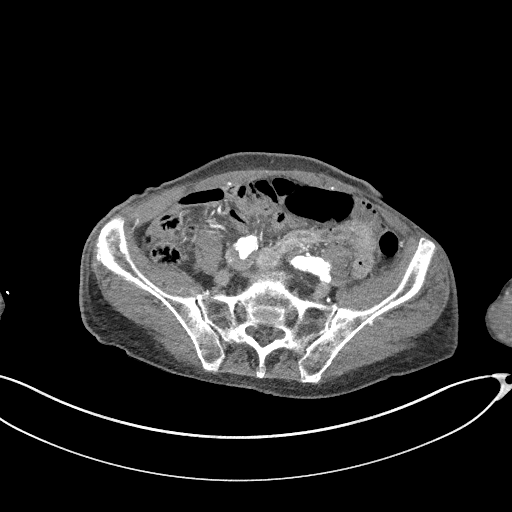
[im 84/218  soft-tissue]
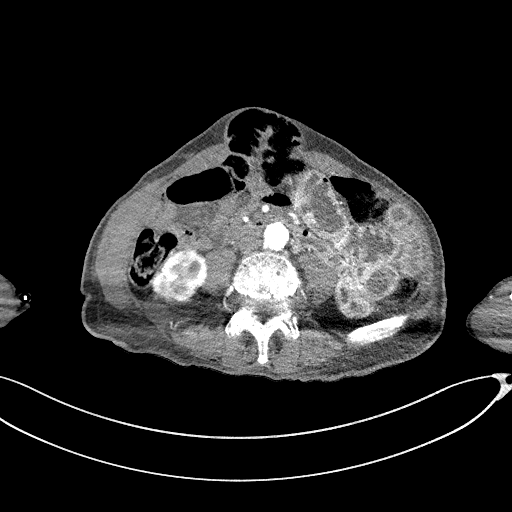
[im 117/218  soft-tissue]
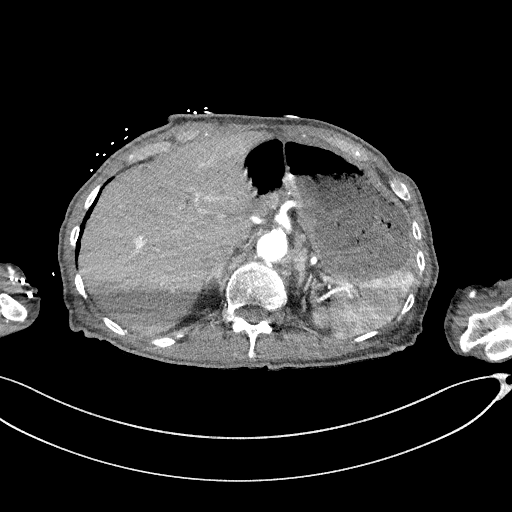
[im 134/218  soft-tissue]
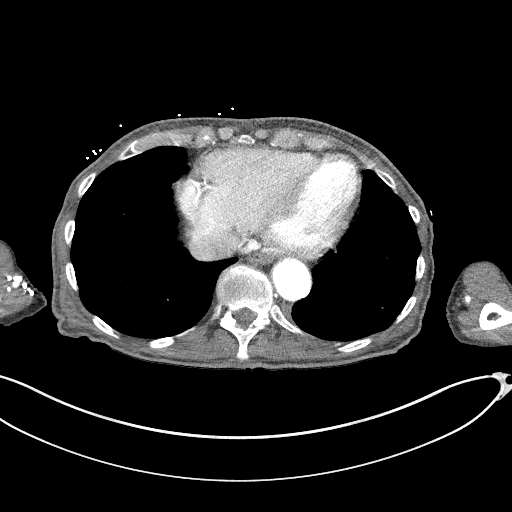
[im 151/218  soft-tissue]
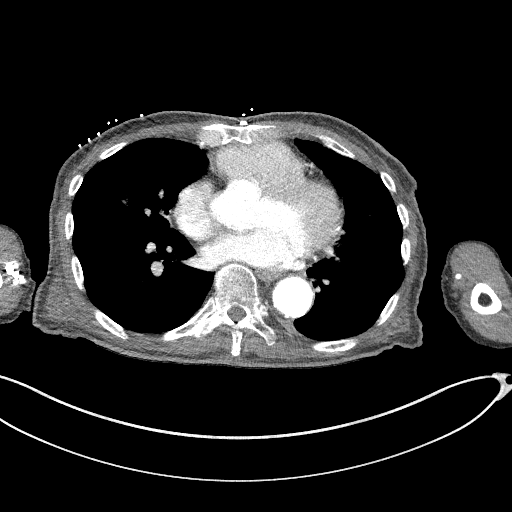
[im 184/218  soft-tissue]
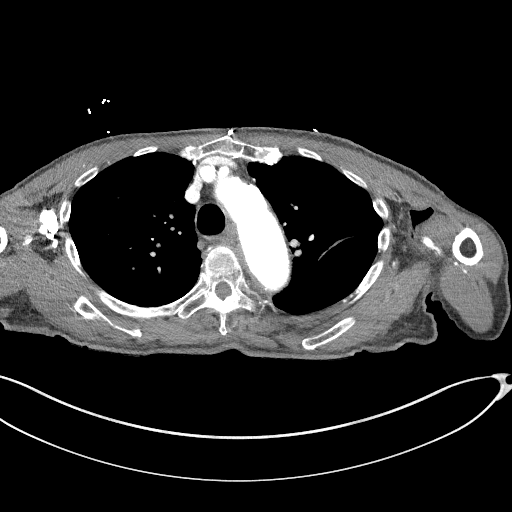
[im 201/218  soft-tissue]
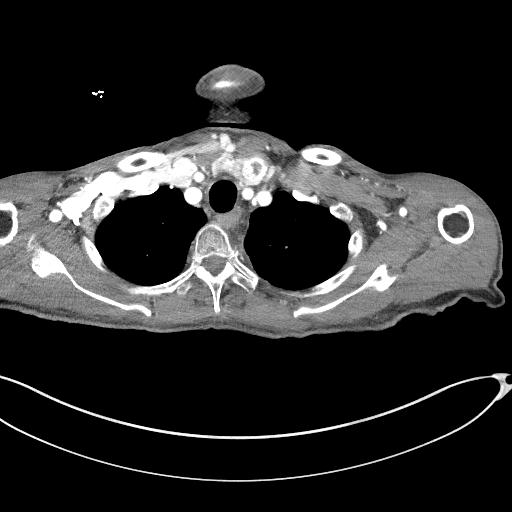
[im 201/218  bone]
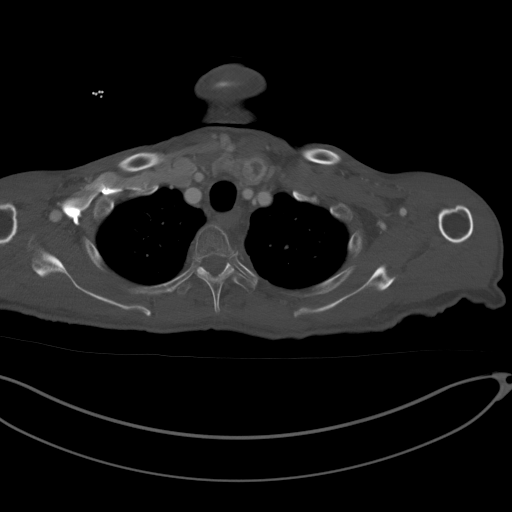

[Series 10: cor soft · coronal · 0.76mm/px · 3 of 146 slices shown]
[im 37/146  soft-tissue]
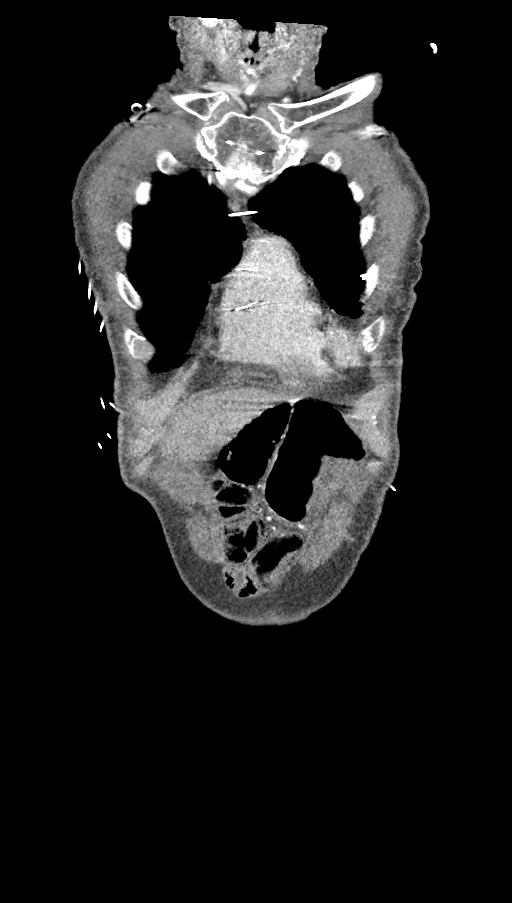
[im 73/146  soft-tissue]
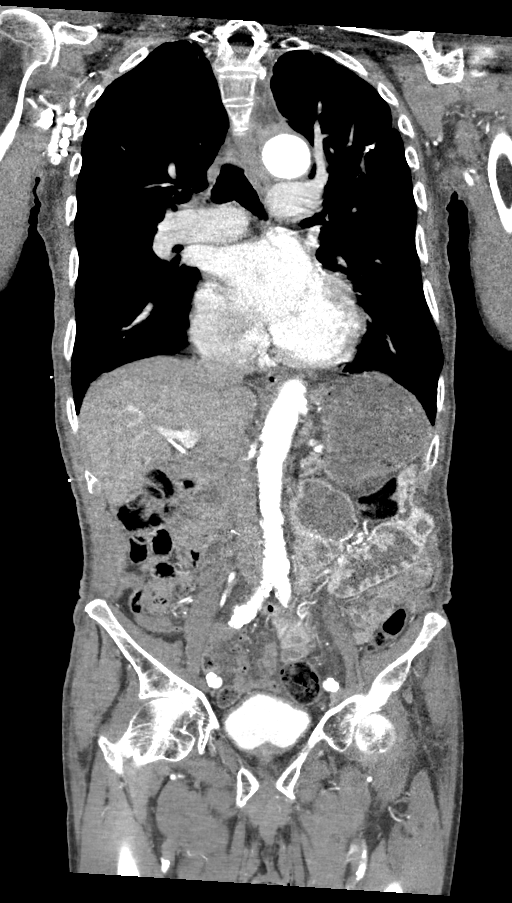
[im 109/146  soft-tissue]
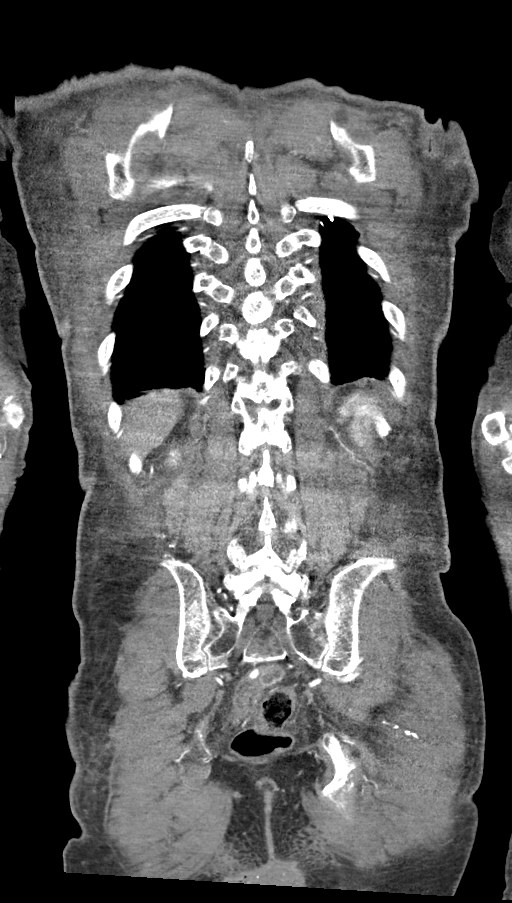

[12 of 46 positions shown; findings below may reference images not displayed]

Multidetector CT imaging through the chest, abdomen and pelvis was
performed using the standard protocol during bolus administration of
intravenous contrast. Multiplanar reconstructed images and MIPs were
obtained and reviewed to evaluate the vascular anatomy.

RADIATION DOSE REDUCTION: This exam was performed according to the
departmental dose-optimization program which includes automated
exposure control, adjustment of the mA and/or kV according to
patient size and/or use of iterative reconstruction technique.

CONTRAST:  Uniformly fills the lumen in this region without evidence
of dissection or aneurysm.

No abdominal aortic aneurysm.
FINDINGS: CTA CHEST FINDINGS

Cardiovascular: Heart size is mildly to moderately enlarged. No
pericardial effusion. Dense coronary artery calcifications are seen.
Moderate atherosclerotic calcifications within the thoracic aorta.

Mediastinum/Nodes: No enlarged mediastinal, hilar, or axillary lymph
nodes. There is again enlarged and heterogeneous left thyroid gland
with internal nodularity, not well evaluated on the current
modality.

Lungs/Pleura: The central airways are patent. Mild-to-moderate
centrilobular emphysematous changes, again greatest within lung
bases. Mild biapical pleural scarring. Mild left major fissure
linear thickening is unchanged from prior. No pleural effusion or
pneumothorax.

Musculoskeletal: Status post median sternotomy. Mild thoracic spine
multilevel degenerative disc changes.

Review of the MIP images confirms the above findings.

CTA ABDOMEN AND PELVIS FINDINGS

VASCULAR

Aorta: No hyperdensity is seen on precontrast images to indicate an
intramural hematoma within the thoracic or abdominal aorta. The
ascending aorta measures up to 3.7 cm in caliber, ectatic but not
aneurysmal. The descending thoracic aorta measures up to 3.1 cm in
caliber to the level of the main pulmonary artery and 2.3 cm in
caliber at the level of the aortic hiatus. The region of questioned
filling defect within the proximal descending thoracic aorta on CTA
chest earlier same day appears to have been secondary to mixing of
contrasted and non contrasted blood. On the current study this
region uniformly enhances without evidence of aortic dissection or
aneurysm.

Celiac: Patent without evidence of aneurysm, dissection, vasculitis
or significant stenosis.

SMA: Patent without evidence of aneurysm, dissection, vasculitis or
significant stenosis.

Renals: The left kidney and renal artery are surgically absent. The
right renal artery is patent and within normal limits.

IMA: An attenuated opacified inferior mesenteric artery is seen on
axial series 6, images 121 through 151.

Inflow: The common, internal, and external iliac arteries are
patent.

Veins: No obvious venous abnormality within the limitations of this
arterial phase study.

Review of the MIP images confirms the above findings.

NON-VASCULAR

Hepatobiliary: Minimally nodular contour of the liver. No focal
arterial phase liver lesion is seen. The gallbladder appears to be
surgically absent. There is again mild air within a left hepatic
lobe bile duct and within the common bile duct presumably secondary
to prior surgery.

Pancreas: Grossly unremarkable.

Spleen: Unremarkable

Adrenals/Urinary Tract: Normal adrenal glands. The left kidney
appears to be surgically absent. There is an 9 mm low-attenuation
lesion within the inferior pole of the left kidney, too small to
further characterize but statistically most likely a cyst.

Stomach/Bowel: The transverse colon extends into a region of
moderate midline ventral rectus diastasis. The appendix is grossly
unremarkable. No dilated loops of bowel to indicate bowel
obstruction.

Lymphatic: No abdominopelvic lymphadenopathy.

Reproductive: The prostate is mildly enlarged.

Other: Moderate left and very mild right inguinal hernias containing
minimal small-bowel in the right and moderate sigmoid colon on the
left.

Musculoskeletal: Grade 1 anterolisthesis of L4 on L5 with severe
L4-5 and moderate L5-S1 degenerative disc changes.

Review of the MIP images confirms the above findings.
IMPRESSION: :
IMPRESSION: 1. No thoracic or abdominal aortic aneurysm or dissection. The
appearance on CT of the chest earlier today likely reflected mixing
of contrasted and non contrasted blood within the proximal
descending thoracic aorta.
2. Mild-to-moderate cardiomegaly. Ectatic ascending aorta. Aortic
Atherosclerosis ([id number]).
3. Minimally nodular contour of the liver. This can be seen with
cirrhosis.
4. Status post cholecystectomy. This surgery and possible ampullary
intervention is likely the etiology for mild pneumobilia.
5. Moderate midline rectus diastasis. Moderate left and very mild
right bowel containing inguinal hernias. No bowel obstruction.

## 2022-06-06 IMAGING — CT CT ANGIO CHEST
2 of 6 series · 17 of 36 positions shown · IV contrast (Omnipaque or Isovue)
Comparison: Chest XR, 06/07/2021 and 05/22/2015.

CLINICAL DATA: Left sided chest pain x 2 days. Pulmonary embolism
(PE) suspected, high prob chest pain. elevated D-dimer

EXAM:
CT ANGIOGRAPHY CHEST WITH CONTRAST
TECHNIQUE: Multidetector CT imaging of the chest was performed using the
standard protocol during bolus administration of intravenous
contrast. Multiplanar CT image reconstructions and MIPs were
obtained to evaluate the vascular anatomy.

[Series 5: pe axial thins · axial · 0.67mm/px · z∈[-333,+8]mm · 16 of 472 slices shown]
[im 23/472  lung]
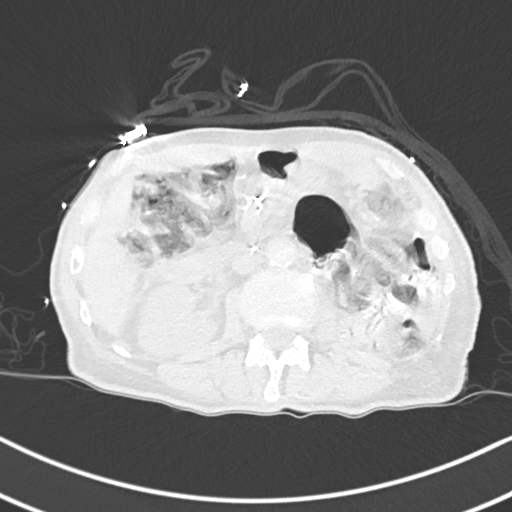
[im 45/472  mediastinal]
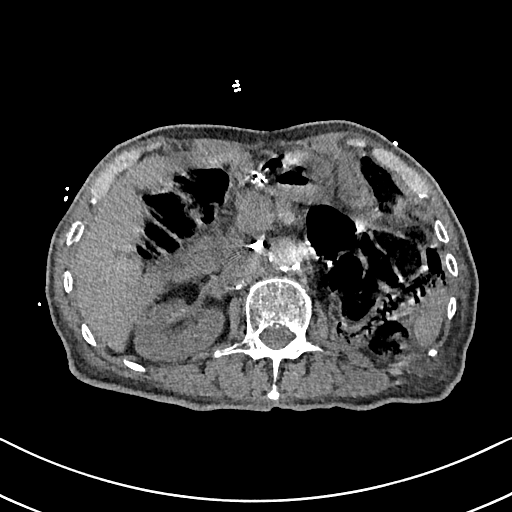
[im 90/472  lung]
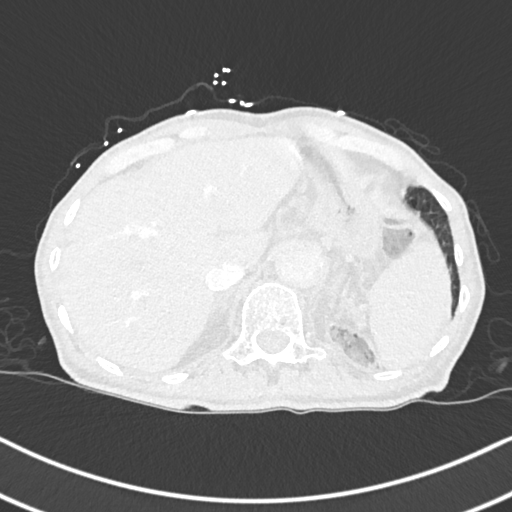
[im 113/472  mediastinal]
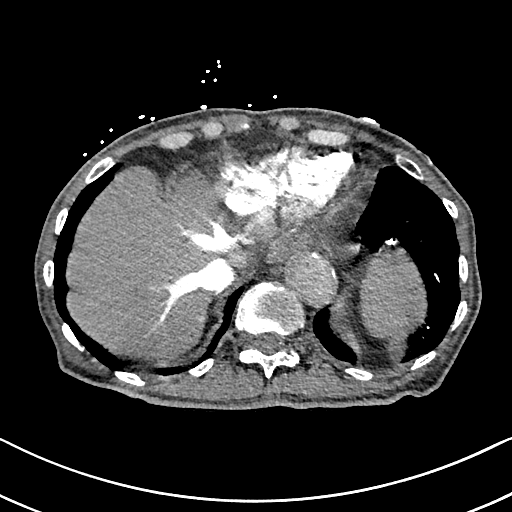
[im 135/472  lung]
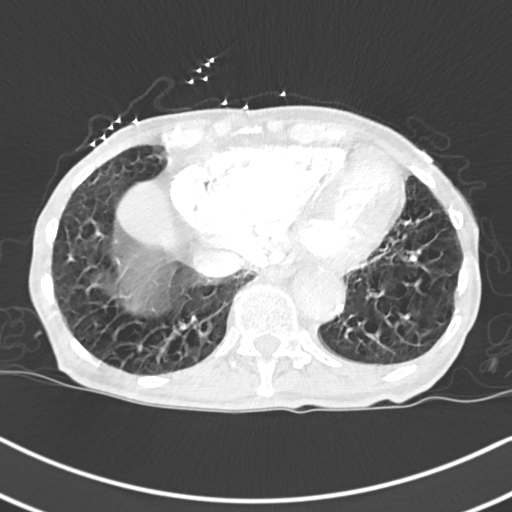
[im 158/472  mediastinal]
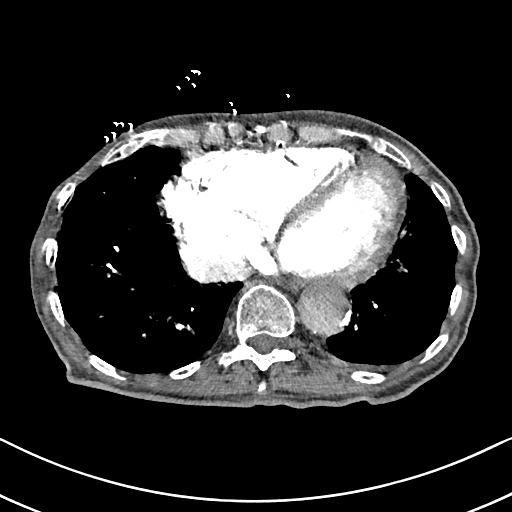
[im 202/472  lung]
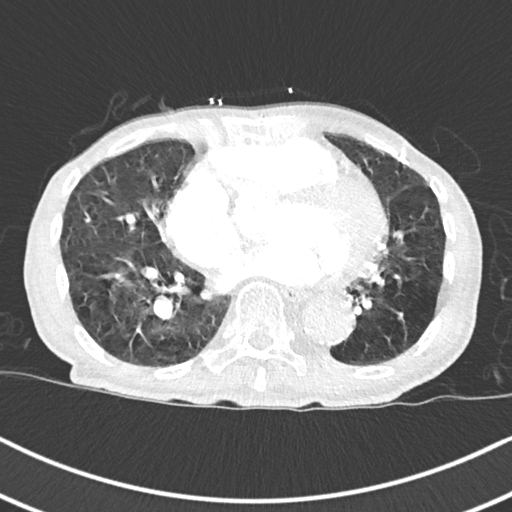
[im 225/472  mediastinal]
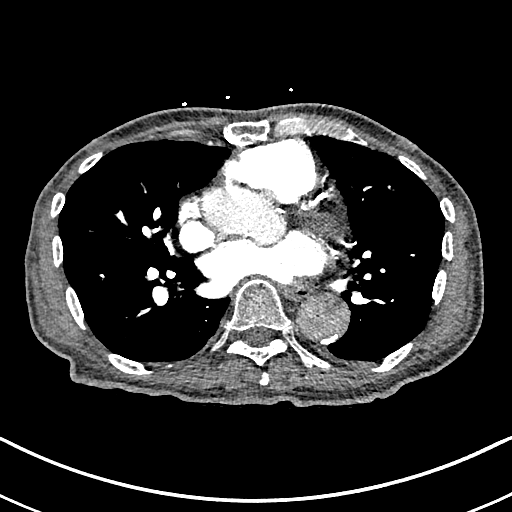
[im 247/472  lung]
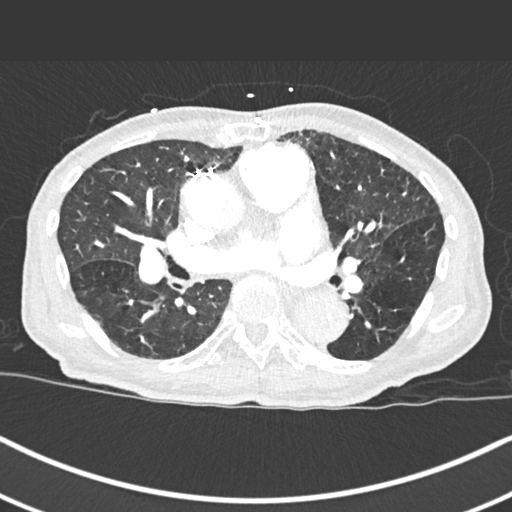
[im 270/472  mediastinal]
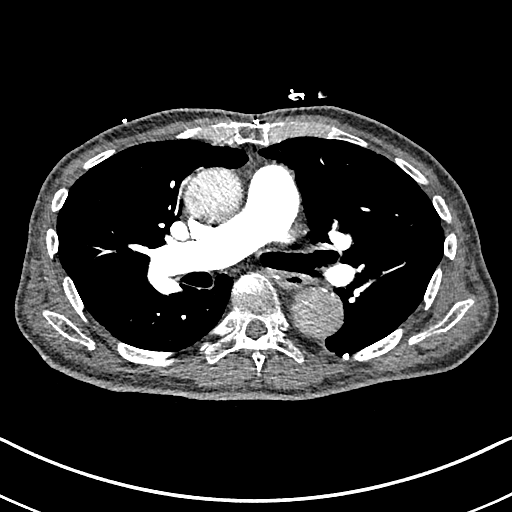
[im 315/472  lung]
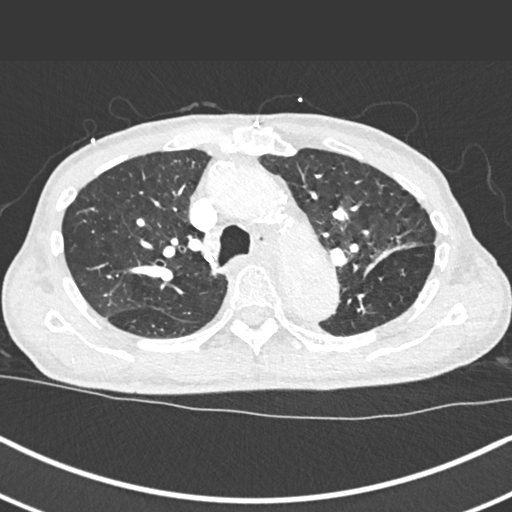
[im 337/472  mediastinal]
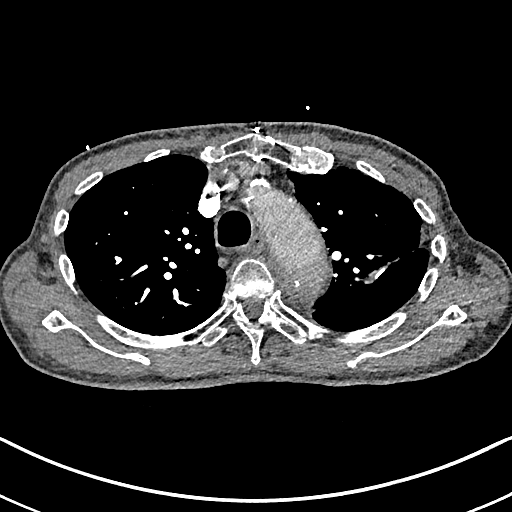
[im 359/472  lung]
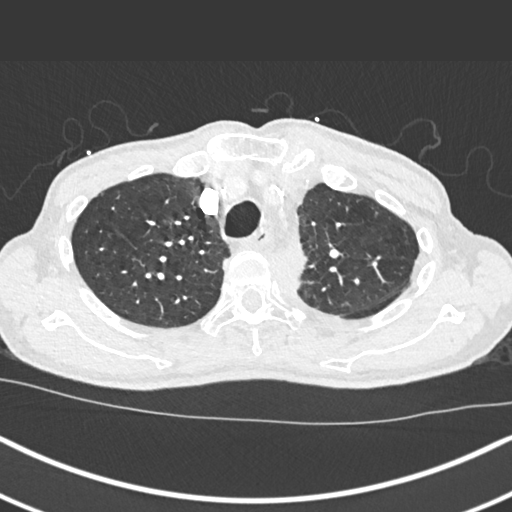
[im 382/472  mediastinal]
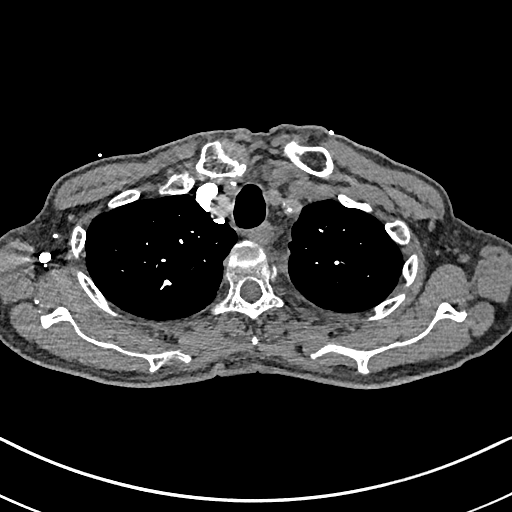
[im 427/472  lung]
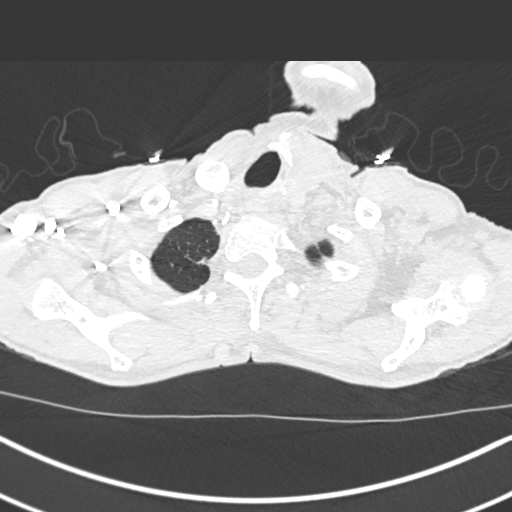
[im 449/472  mediastinal]
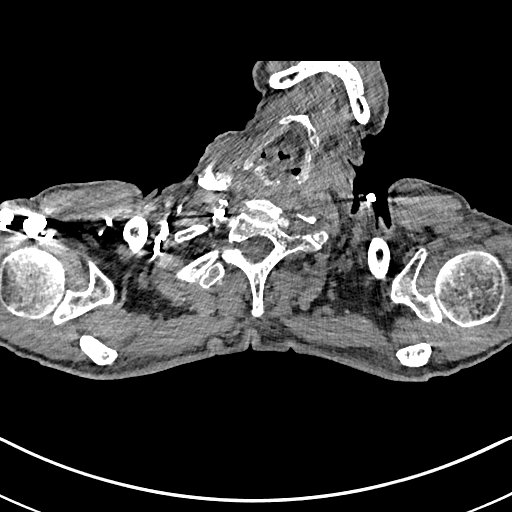

[Series 7: cor soft · coronal · 0.70mm/px · 1 of 116 slices shown]
[im 58/116  mediastinal]
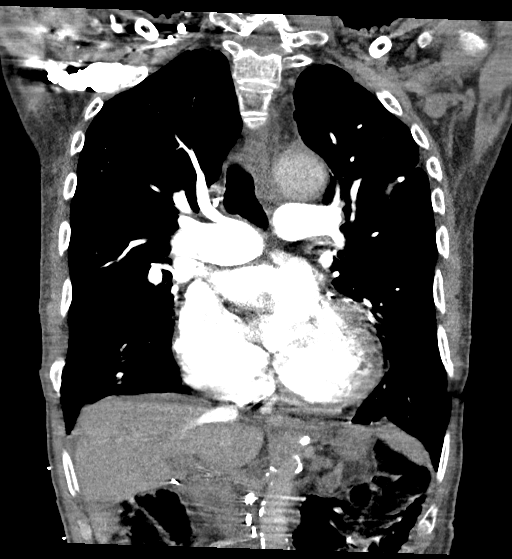

[17 of 36 positions shown; findings below may reference images not displayed]

RADIATION DOSE REDUCTION: This exam was performed according to the
departmental dose-optimization program which includes automated
exposure control, adjustment of the mA and/or kV according to
patient size and/or use of iterative reconstruction technique.

CONTRAST:  75mL OMNIPAQUE IOHEXOL 350 MG/ML SOLN
FINDINGS: Suboptimal evaluation, secondary to motion degradation.

Cardiovascular: Satisfactory opacification of the pulmonary arteries
to the segmental level. No segmental or larger pulmonary embolus.

3.6 cm ascending thoracic aorta.

Poorly assessed filling defect appearance of the proximal descending
thoracic aorta, with adjacent posterior mediastinal soft tissue
thickening.

Cardiomegaly with multi chamber enlargement. No pericardial
effusion. A severe burden of multivessel coronary atherosclerosis is
present. Main PA enlargement, measuring 3.3 cm likely reflecting
underlying pulmonary arterial hypertension.

Mediastinum/Nodes: No enlarged mediastinal, hilar, or axillary lymph
nodes. Enlarged, nodular appearance of the LEFT thyroid gland.
Thyroid gland, trachea, and esophagus demonstrate no significant
findings.

Lungs/Pleura: Centrilobular emphysematous lung changes, greatest
within the lung bases. LEFT apical pleural thickening. LEFT fissural
thickening with calcification, no discrete mass lesion. 4 mm RIGHT
upper lobe pulmonary nodule with internal calcification. No pleural
effusion or pneumothorax.

Upper Abdomen: Severely degraded for evaluation. Cholecystectomy
clips. Colonic interposition at the LEFT upper quadrant.

Musculoskeletal: Median sternotomy. No acute chest wall abnormality.
Lower cervical spine degenerative change, incompletely imaged. No
acute osseous findings.

Review of the MIP images confirms the above findings.
IMPRESSION: 1. No segmental or larger pulmonary embolus.
2. Main pulmonary arterial enlargement, reflecting underlying
pulmonary arterial hypertension.
3. Filling defect appearance of the proximal descending thoracic
aorta with posterior mediastinal soft tissue thickening. This is
poorly assessed on this evaluation, and differential diagnosis
includes dissection with intimal flap and intramural hematoma,
however contrast mixing can appear similar. Recommend dedicated
repeat CTA chest dissection protocol for further evaluation.
4. Centrilobular emphysematous lung changes with LEFT fissural
thickening and calcifications. No discrete mass lesion. Attention on
follow-up.
5. Incidental heterogeneous and enlarged LEFT thyroid. Recommend
non-emergent, outpatient thyroid US if clinically warranted given
patient age. Reference: [HOSPITAL]. [DATE]): 143-50

These results were called by telephone at the time of interpretation
on 06/07/2021 at [DATE] to provider SRBUHI DAV , who verbally
acknowledged these results.

## 2022-06-06 IMAGING — DX DG CHEST 1V PORT
1 series · 1 of 1 positions shown · non-contrast
Comparison: 05/22/2015

CLINICAL DATA: Chest pain

EXAM:
PORTABLE CHEST 1 VIEW

[chest ap]
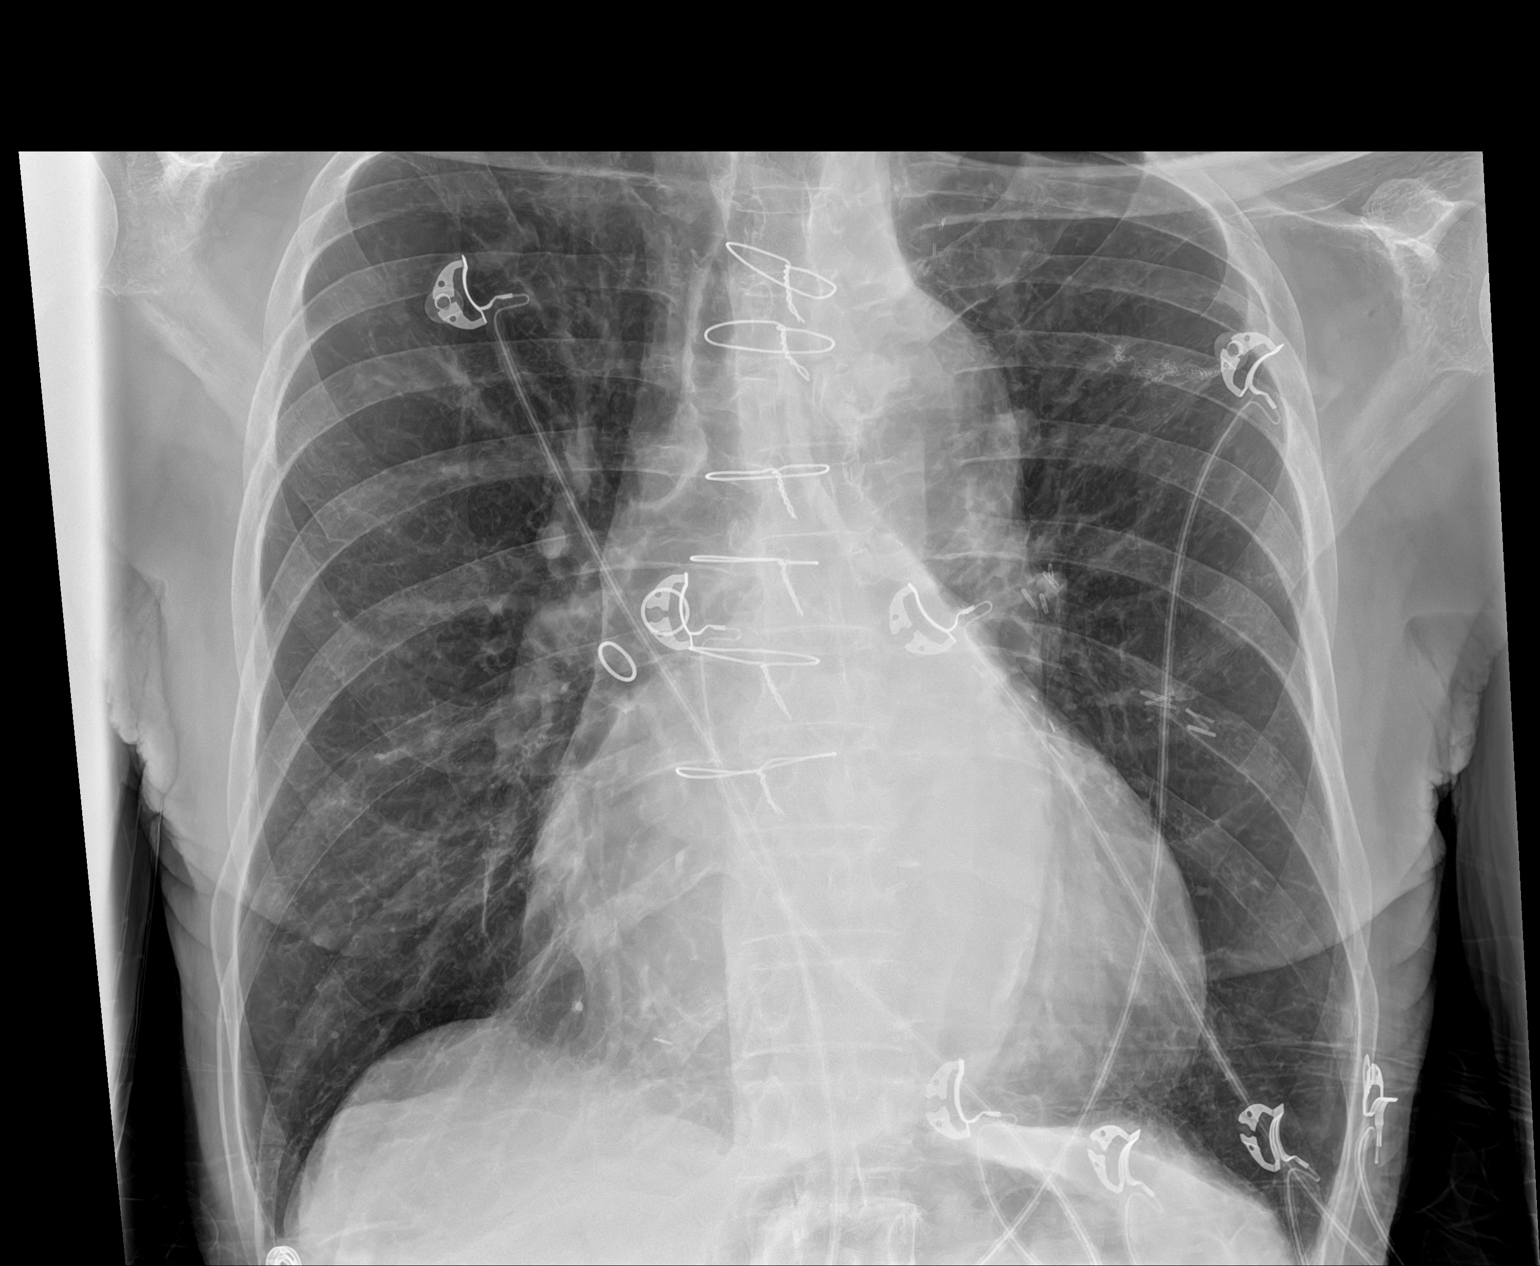

[1 of 1 positions shown; findings below may reference images not displayed]

FINDINGS: Sternotomy wires overlie normal cardiac silhouette. Ectatic aorta.
Normal pulmonary vasculature. No effusion, infiltrate, or
pneumothorax. No acute osseous abnormality.
IMPRESSION: 1.  No acute cardiopulmonary process.
2. No change from prior.
